# Patient Record
Sex: Female | Born: 1950 | Race: Black or African American | Hispanic: No | Smoking: Never smoker
Health system: Southern US, Community
[De-identification: ages and names within clinical notes are randomized; demographics above are authoritative.]

## PROBLEM LIST (undated history)

## (undated) DIAGNOSIS — Z972 Presence of dental prosthetic device (complete) (partial): Secondary | ICD-10-CM

## (undated) DIAGNOSIS — I10 Essential (primary) hypertension: Secondary | ICD-10-CM

## (undated) DIAGNOSIS — K219 Gastro-esophageal reflux disease without esophagitis: Secondary | ICD-10-CM

## (undated) DIAGNOSIS — E785 Hyperlipidemia, unspecified: Secondary | ICD-10-CM

## (undated) HISTORY — PX: PARTIAL HYSTERECTOMY: SHX80

---

## 2005-08-19 ENCOUNTER — Ambulatory Visit: Payer: Self-pay

## 2008-07-03 ENCOUNTER — Ambulatory Visit: Payer: Self-pay

## 2009-10-16 ENCOUNTER — Ambulatory Visit: Payer: Self-pay

## 2010-10-29 ENCOUNTER — Ambulatory Visit: Payer: Self-pay

## 2011-11-04 ENCOUNTER — Ambulatory Visit: Payer: Self-pay

## 2012-12-07 ENCOUNTER — Ambulatory Visit: Payer: Self-pay

## 2013-12-20 ENCOUNTER — Ambulatory Visit: Payer: Self-pay

## 2014-10-12 ENCOUNTER — Other Ambulatory Visit: Payer: Self-pay | Admitting: Oncology

## 2014-10-12 DIAGNOSIS — Z139 Encounter for screening, unspecified: Secondary | ICD-10-CM

## 2014-12-26 ENCOUNTER — Encounter: Payer: Self-pay | Admitting: *Deleted

## 2014-12-26 ENCOUNTER — Ambulatory Visit
Admission: RE | Admit: 2014-12-26 | Discharge: 2014-12-26 | Disposition: A | Discharge status: Home / Self Care | Payer: Self-pay | Source: Ambulatory Visit | Attending: Oncology | Admitting: Oncology

## 2014-12-26 ENCOUNTER — Ambulatory Visit: Payer: Self-pay

## 2014-12-26 ENCOUNTER — Ambulatory Visit: Payer: Self-pay | Attending: Oncology | Admitting: *Deleted

## 2014-12-26 VITALS — BP 120/86 | HR 74 | Temp 95.0°F | Resp 20 | Ht 61.42 in | Wt 147.3 lb

## 2014-12-26 DIAGNOSIS — Z0000 Encounter for general adult medical examination without abnormal findings: Secondary | ICD-10-CM

## 2014-12-26 NOTE — Progress Notes (Signed)
Subjective:     Patient ID: Kim Potter, female   DOB: 1951/01/22, 64 y.o.   MRN: [id number]  HPI   Review of Systems     Objective:   Physical Exam  Pulmonary/Chest: Right breast exhibits no inverted nipple, no mass, no nipple discharge, no skin change and no tenderness. Left breast exhibits no inverted nipple, no mass, no nipple discharge, no skin change and no tenderness. Breasts are symmetrical.  Lymphadenopathy:       Right axillary: No pectoral and no lateral adenopathy present.       Left axillary: No pectoral and no lateral adenopathy present.      Assessment:  64 year old Black female returns to [facility] for annual screening.  Clinical breast exam unremarkable.  Taught self breast awareness.  Patient has been screened for eligibility.  She does not have any insurance, Medicare or Medicaid.  She also meets financial eligibility.  Hand-out given on the Affordable Care Act.    Plan:  Screening mammogram ordered.  Follow-up per protocol.  Handout given on self breast awareness.

## 2014-12-27 ENCOUNTER — Other Ambulatory Visit: Payer: Self-pay | Admitting: *Deleted

## 2014-12-27 DIAGNOSIS — N63 Unspecified lump in unspecified breast: Secondary | ICD-10-CM

## 2015-01-02 ENCOUNTER — Ambulatory Visit
Admission: RE | Admit: 2015-01-02 | Discharge: 2015-01-02 | Disposition: A | Discharge status: Home / Self Care | Payer: Self-pay | Source: Ambulatory Visit | Attending: Oncology | Admitting: Oncology

## 2015-01-02 DIAGNOSIS — N63 Unspecified lump in unspecified breast: Secondary | ICD-10-CM

## 2015-01-02 DIAGNOSIS — N6001 Solitary cyst of right breast: Secondary | ICD-10-CM | POA: Insufficient documentation

## 2015-01-03 ENCOUNTER — Encounter: Payer: Self-pay | Admitting: *Deleted

## 2015-01-03 NOTE — Progress Notes (Signed)
Letter mailed from the [facility] to inform patient of her normal mammogram results.  Patient is to follow-up with annual screening in one year.  [facility] to Christy. 

## 2016-01-08 ENCOUNTER — Ambulatory Visit
Admission: RE | Admit: 2016-01-08 | Discharge: 2016-01-08 | Disposition: A | Discharge status: Home / Self Care | Payer: Self-pay | Source: Ambulatory Visit | Attending: Internal Medicine | Admitting: Internal Medicine

## 2016-01-08 ENCOUNTER — Ambulatory Visit: Payer: Self-pay | Attending: Internal Medicine

## 2016-01-08 VITALS — BP 126/88 | HR 81 | Temp 98.0°F | Ht 61.42 in | Wt 148.9 lb

## 2016-01-08 DIAGNOSIS — Z0000 Encounter for general adult medical examination without abnormal findings: Secondary | ICD-10-CM

## 2016-01-08 NOTE — Progress Notes (Signed)
Subjective:     Patient ID: Kim Potter, female   DOB: 1951/02/07, 65 y.o.   MRN: [id number]  HPI   Review of Systems     Objective:   Physical Exam  Pulmonary/Chest: Right breast exhibits no inverted nipple, no mass, no nipple discharge, no skin change and no tenderness. Left breast exhibits no inverted nipple, no mass, no nipple discharge, no skin change and no tenderness. Breasts are symmetrical.  Genitourinary: No labial fusion. There is no rash, tenderness, lesion or injury on the right labia. There is no rash, tenderness, lesion or injury on the left labia. Right adnexum displays no mass, no tenderness and no fullness. Left adnexum displays no mass, no tenderness and no fullness. No erythema, tenderness or bleeding in the vagina. No foreign body around the vagina. No signs of injury around the vagina. No vaginal discharge found.  Parital hysterectomy 40 years ago       Assessment:  65 year old  patient presents for [facility] clinic visit.  Patient screened, and meets [facility] eligibility.  Patient does not have insurance, Medicare or Medicaid.  Handout given on Affordable Care Act.  Instructed patient on breast self-exam using teach back method.  CBE unremarkable.  No mass or lump palpated.  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    Plan:    Sent for bilateral screening mammogram.

## 2016-01-09 ENCOUNTER — Other Ambulatory Visit: Payer: Self-pay | Admitting: *Deleted

## 2016-01-09 DIAGNOSIS — N6489 Other specified disorders of breast: Secondary | ICD-10-CM

## 2016-01-29 ENCOUNTER — Ambulatory Visit
Admission: RE | Admit: 2016-01-29 | Discharge: 2016-01-29 | Disposition: A | Discharge status: Home / Self Care | Payer: Self-pay | Source: Ambulatory Visit | Attending: Internal Medicine | Admitting: Internal Medicine

## 2016-01-29 DIAGNOSIS — N6489 Other specified disorders of breast: Secondary | ICD-10-CM

## 2016-02-19 NOTE — Progress Notes (Signed)
Letter mailed from [facility] to notify of normal mammogram results.  Patient to return in one year for annual screening.  Copy to HSIS. 

## 2017-01-04 ENCOUNTER — Telehealth: Payer: Self-pay | Admitting: Pharmacy Technician

## 2017-01-04 NOTE — Telephone Encounter (Signed)
Patient eligible to receive medication assistance at [facility] until 08/27/17 as long as eligibility requirements continue to be met.  Caelyn Route J. Owen Pagnotta Care Manager [facility] 

## 2017-01-13 ENCOUNTER — Ambulatory Visit
Admission: RE | Admit: 2017-01-13 | Discharge: 2017-01-13 | Disposition: A | Discharge status: Home / Self Care | Payer: Self-pay | Source: Ambulatory Visit | Attending: Oncology | Admitting: Oncology

## 2017-01-13 ENCOUNTER — Ambulatory Visit: Payer: Self-pay | Attending: Oncology

## 2017-01-13 VITALS — BP 137/84 | HR 80 | Temp 96.9°F | Resp 18 | Ht 61.0 in | Wt 135.0 lb

## 2017-01-13 DIAGNOSIS — Z0000 Encounter for general adult medical examination without abnormal findings: Secondary | ICD-10-CM

## 2017-01-13 NOTE — Progress Notes (Signed)
Subjective:     Patient ID: Kim Potter, female   DOB: March 12, 1951, 66 y.o.   MRN: [id number]  HPI   Review of Systems     Objective:   Physical Exam  Pulmonary/Chest: Right breast exhibits no inverted nipple, no mass, no nipple discharge, no skin change and no tenderness. Left breast exhibits no inverted nipple, no mass, no nipple discharge, no skin change and no tenderness. Breasts are symmetrical.       Assessment:     66 year old patient presents for [facility] clinic visitPatient screened, and meets [facility] eligibility.  Patient does not have insurance, Medicare or Medicaid.  Handout given on Affordable Care Act. Wardell Honour RN instructed patient on breast self-exam using teach back method.  CBE unremarkable.  No mass or lump palpated.  History of hysterectomy. Ovaries intact.  Pelvic exam normal.    Plan:     Sent for bilateral screening mammogram.

## 2017-01-14 NOTE — Progress Notes (Signed)
Letter mailed from [facility] to notify of normal mammogram results.  Patient to return in one year for annual screening.  Copy to HSIS. 

## 2017-03-30 ENCOUNTER — Emergency Department
Admission: EM | Admit: 2017-03-30 | Discharge: 2017-03-30 | Disposition: A | Discharge status: Home / Self Care | Payer: Self-pay | Attending: Emergency Medicine | Admitting: Emergency Medicine

## 2017-03-30 ENCOUNTER — Encounter: Payer: Self-pay | Admitting: Emergency Medicine

## 2017-03-30 ENCOUNTER — Emergency Department: Payer: Self-pay

## 2017-03-30 DIAGNOSIS — R079 Chest pain, unspecified: Secondary | ICD-10-CM | POA: Insufficient documentation

## 2017-03-30 DIAGNOSIS — R002 Palpitations: Secondary | ICD-10-CM | POA: Insufficient documentation

## 2017-03-30 DIAGNOSIS — I10 Essential (primary) hypertension: Secondary | ICD-10-CM | POA: Insufficient documentation

## 2017-03-30 HISTORY — DX: Essential (primary) hypertension: I10

## 2017-03-30 HISTORY — DX: Hyperlipidemia, unspecified: E78.5

## 2017-03-30 LAB — BASIC METABOLIC PANEL
Anion gap: 10 (ref 5–15)
BUN: 8 mg/dL (ref 6–20)
CALCIUM: 9.4 mg/dL (ref 8.9–10.3)
CO2: 28 mmol/L (ref 22–32)
CREATININE: 0.85 mg/dL (ref 0.44–1.00)
Chloride: 104 mmol/L (ref 101–111)
Glucose, Bld: 127 mg/dL — ABNORMAL HIGH (ref 65–99)
Potassium: 3.5 mmol/L (ref 3.5–5.1)
SODIUM: 142 mmol/L (ref 135–145)

## 2017-03-30 LAB — CBC
HCT: 39.7 % (ref 35.0–47.0)
Hemoglobin: 12.8 g/dL (ref 12.0–16.0)
MCH: 22.9 pg — ABNORMAL LOW (ref 26.0–34.0)
MCHC: 32.3 g/dL (ref 32.0–36.0)
MCV: 70.9 fL — ABNORMAL LOW (ref 80.0–100.0)
PLATELETS: 200 10*3/uL (ref 150–440)
RBC: 5.6 MIL/uL — AB (ref 3.80–5.20)
RDW: 15.5 % — ABNORMAL HIGH (ref 11.5–14.5)
WBC: 7.3 10*3/uL (ref 3.6–11.0)

## 2017-03-30 LAB — TROPONIN I

## 2017-03-30 LAB — TSH: TSH: 2.447 u[IU]/mL (ref 0.350–4.500)

## 2017-03-30 LAB — MAGNESIUM: MAGNESIUM: 1.9 mg/dL (ref 1.7–2.4)

## 2017-03-30 MED ORDER — MAGNESIUM SULFATE 2 GM/50ML IV SOLN
2.0000 g | Freq: Once | INTRAVENOUS | Status: AC
  Administered 2017-03-30: 2 g via INTRAVENOUS
  Filled 2017-03-30: qty 50

## 2017-03-30 MED ORDER — SODIUM CHLORIDE 0.9 % IV BOLUS (SEPSIS)
500.0000 mL | Freq: Once | INTRAVENOUS | Status: AC
  Administered 2017-03-30: 500 mL via INTRAVENOUS

## 2017-03-30 MED ORDER — METOPROLOL TARTRATE 25 MG PO TABS: 25.0000 mg | ORAL_TABLET | Freq: Two times a day (BID) | ORAL | 11 refills | Status: DC

## 2017-03-30 NOTE — ED Notes (Signed)
Assisted patient to the restroom.  

## 2017-03-30 NOTE — ED Provider Notes (Signed)
[facility] Emergency Department Provider Note   ____________________________________________   First MD Initiated Contact with Patient 03/30/17 [phone]     (approximate)  I have reviewed the triage vital signs and the nursing notes.   HISTORY  Chief Complaint Hypertension and Chest Pain    HPI Kim Potter is a 66 y.o. female who comes into the hospital today stating that she didn't feel well. Her blood pressure was high and her heart was racing. The patient states that she was laying in bed when the symptoms started. She had some slight chest discomfort like indigestion but no overt pain. The patient denies any shortness of breath nausea or vomiting. She reports that her doctor had recently decreased her blood pressure medicine but she wasn't sure if she needed to be back on more. The patient reports that her blood pressure was increased only today. She also did have a headache but it is currently gone. The patient denies any dizziness or lightheadedness. She rates her pain a 1 out of 10 in intensity. Since her blood pressure was elevated the patient wanted to come in and get checked out. She is here today for evaluation.   Past Medical History:  Diagnosis Date  . Hyperlipidemia   . Hypertension     There are no active problems to display for this patient.   Past Surgical History:  Procedure Laterality Date  . PARTIAL HYSTERECTOMY      Prior to Admission medications   Not on File    Allergies Penicillins  Family History  Problem Relation Age of Onset  . Breast cancer Neg Hx     Social History Social History  Substance Use Topics  . Smoking status: Never Smoker  . Smokeless tobacco: Never Used  . Alcohol use No    Review of Systems  Constitutional: No fever/chills Eyes: No visual changes. ENT: No sore throat. Cardiovascular: palpitations and chest pain Respiratory: Denies shortness of breath. Gastrointestinal: No abdominal pain.   No nausea, no vomiting.  No diarrhea.  No constipation. Genitourinary: Negative for dysuria. Musculoskeletal: Negative for back pain. Skin: Negative for rash. Neurological: Negative for headaches, focal weakness or numbness.   ____________________________________________   PHYSICAL EXAM:  VITAL SIGNS: ED Triage Vitals  Enc Vitals Group     BP 03/30/17 0320 (!) 173/104     Pulse Rate 03/30/17 0320 (!) 125     Resp 03/30/17 0320 18     Temp 03/30/17 0320 98.2 F (36.8 C)     Temp Source 03/30/17 0320 Oral     SpO2 03/30/17 0320 99 %     Weight 03/30/17 0321 142 lb (64.4 kg)     Height 03/30/17 0321 5\' 1"  (1.549 m)     Head Circumference --      Peak Flow --      Pain Score 03/30/17 0320 2     Pain Loc --      Pain Edu? --      Excl. in [place]? --     Constitutional: Alert and oriented. Well appearing and in mild distress. Eyes: Conjunctivae are normal. PERRL. EOMI. Head: Atraumatic. Nose: No congestion/rhinnorhea. Mouth/Throat: Mucous membranes are moist.  Oropharynx non-erythematous. Cardiovascular: tachycardia, regular rhythm. Grossly normal heart sounds.  Good peripheral circulation. Respiratory: Normal respiratory effort.  No retractions. Lungs CTAB. Gastrointestinal: Soft and nontender. No distention. positive bowel sounds Musculoskeletal: No lower extremity tenderness nor edema.   Neurologic:  Normal speech and language.  Skin:  Skin  is warm, dry and intact.  Psychiatric: Mood and affect are normal.   ____________________________________________   LABS (all labs ordered are listed, but only abnormal results are displayed)  Labs Reviewed  BASIC METABOLIC PANEL - Abnormal; Notable for the following:       Result Value   Glucose, Bld 127 (*)    All other components within normal limits  CBC - Abnormal; Notable for the following:    RBC 5.60 (*)    MCV 70.9 (*)    MCH 22.9 (*)    RDW 15.5 (*)    All other components within normal limits  TROPONIN I  TSH    MAGNESIUM  TROPONIN I   ____________________________________________  EKG  ED ECG REPORT I, Loney Hering, the attending physician, personally viewed and interpreted this ECG.   Date: 03/30/2017  EKG Time: 310  Rate: 126  Rhythm: sinus tachycardia  Axis: normal  Intervals:none  ST&T Change: none  ____________________________________________  RADIOLOGY  Dg Chest 2 View  Result Date: 03/30/2017 CLINICAL DATA:  Headache and chest pain.  History of hypertension. EXAM: CHEST  2 VIEW COMPARISON:  None. FINDINGS: Normal heart size and pulmonary vascularity. Peribronchial thickening with central interstitial changes likely representing acute or chronic bronchitis versus airways disease. No focal consolidation or airspace disease. No blunting of costophrenic angles. No pneumothorax. IMPRESSION: Peribronchial thickening and central interstitial changes suggesting bronchitis versus airways disease. No focal consolidation. Electronically Signed   By: Lucienne Capers M.D.   On: 03/30/2017 03:47    ____________________________________________   PROCEDURES  Procedure(s) performed: None  Procedures  Critical Care performed: No  ____________________________________________   INITIAL IMPRESSION / ASSESSMENT AND PLAN / ED COURSE  Pertinent labs & imaging results that were available during my care of the patient were reviewed by me and considered in my medical decision making (see chart for details).  this is a 66 year old female who comes into the hospital today with palpitations, hypertension and chest discomfort. I did check some blood work which was unremarkable. The patient's tachycardia was sinus tachycardia. I gave the patient a 500 mL bolus of normal saline and checked a magnesium a TSH and a troponin. The patient's magnesium returned at 1.9. I did give the patient a dose of magnesium as well. The patient did have resolution of her tachycardia but then it did return. This  was prior to the magnesium. I will give the patient a second 500, bolus of normal saline and I will discharge the patient. The patient's repeat troponin was also unremarkable. The patient is taking triamterene and HCTZ. After the fluids the patient will be discharged to home if she is still asymptomatic.      ____________________________________________   FINAL CLINICAL IMPRESSION(S) / ED DIAGNOSES  Final diagnoses:  Palpitations  Hypertension, unspecified type      NEW MEDICATIONS STARTED DURING THIS VISIT:  New Prescriptions   No medications on file     Note:  This document was prepared using Dragon voice recognition software and may include unintentional dictation errors.    Loney Hering, MD 03/30/17 [phone]

## 2017-03-30 NOTE — ED Provider Notes (Signed)
patient is in no distress and repeat troponin is negative. I will start a low-dose beta blocker and advised outpatient follow-up with her doctor in a week.   Earleen Newport, MD 03/30/17 0830

## 2017-03-30 NOTE — ED Notes (Signed)
NAD noted at time of D/C. Pt denies questions or concerns. Pt ambulatory to the lobby at this time.  

## 2017-03-30 NOTE — ED Notes (Signed)
This RN to bedside to introduce self to patient. Pt up to the bathroom and back to bed without incident with this RN at bedside. Medication administered per MD order. Pt denies further needs. Will continue to monitor for further patient needs.

## 2017-03-30 NOTE — ED Notes (Signed)
Medication adminstered per MD order. Pt once again up to the bathroom and back with this RN at bedside, without incident. Pt denies any further needs. Will continue to monitor for further patient needs.

## 2017-03-30 NOTE — Discharge Instructions (Signed)
please ensure that she was drinking adequate amounts of water. Also please follow back up with her primary care physician for further evaluation of her blood pressure and medication.

## 2017-03-30 NOTE — ED Triage Notes (Signed)
Patient to ER for c/o HTN. States she has had slight headache and minor chest pain. Also states her chest has felt "like it was pounding".

## 2017-06-30 ENCOUNTER — Telehealth: Payer: Self-pay | Admitting: Pharmacist

## 2017-06-30 NOTE — Telephone Encounter (Signed)
Faxed Dr. Joella Prince 06-28-17 regarding potential drug interactions for Ms. Luce. Dr. Joella Prince faxed back a response on 06/30/17.   Patient was recently prescribed clarithromycin, omeprazole and metronidazole.  Previously a quadruple therapy (omeprazole, bismuth subcitrate, metronidazole and tetracycline) for H. Pylori, but [facility] was not able to obtain the tetracycline.  Potential drug-drug interactions noted: Simvastatin 20 mg at bedtime and clarithromycin- Level 1 per QS1- risk of increased levels of statin Clarithromycin/Citalopram; Fluconazole/Clarithromycin; Fluconazole/Citalopram: all three have an increased risk of QT prolongation or torsades de pointes. Pt is 67 years old.  Pharmacist Suggestion: Hold simvastatin during 14-day treatment Hold fluconazole until after (if appropriate)  MD response: Declined holding simvastatin; feels dose is low Declined holding fluconazole; stated patient is no longer taking.  Patient will be counseled when she picks up her medications.  Will let her know that she does not need to take the bismuth with the triple therapy regimen. Also tried to call Ms. Zavadil; left a message to call me.  Milt Coye K. Dicky Doe, PharmD [facility] [place] Operations Coordinator [phone]

## 2017-08-02 ENCOUNTER — Encounter (INDEPENDENT_AMBULATORY_CARE_PROVIDER_SITE_OTHER): Payer: Self-pay

## 2017-08-02 ENCOUNTER — Ambulatory Visit: Payer: Self-pay | Admitting: Pharmacist

## 2017-08-02 ENCOUNTER — Other Ambulatory Visit: Payer: Self-pay

## 2017-08-02 ENCOUNTER — Encounter: Payer: Self-pay | Admitting: Pharmacist

## 2017-08-02 DIAGNOSIS — Z79899 Other long term (current) drug therapy: Secondary | ICD-10-CM

## 2017-08-02 NOTE — Patient Instructions (Signed)
Look at extra help application with your daughter. (Social Security website).   Once you find out how much subsidy you qualify for from either Social Security and Social Services, you can look at the [employer] plan.  You can use www.medicare.gov to compare plans.

## 2017-08-02 NOTE — Progress Notes (Signed)
Kim Potter is here today to review Medicare Part D/Advantage plans. She states that she is in the "information gathering" phase.  Patient's current medication list was reviewed and all of her medications can be obtained through a Medicare Part D/Advantage plan. Patient will be eligible to sign up for a Medicare Part D/Advantage plan on 08/27/2017. Medicare.gov was used to review the available Part D/Advantage plans. Differences and similarities of traditional Medicare with a Medicare Part D plan vs. a Medicare Advantage (A, B and D) plan were discussed. Patient qualifies for the following subsidies: MSP/LIS*   *MSP = Medicare Savings Plan (Assists with Medicare A and B - premiums, deductibles, coinsurance and copayments) *LIS = Low Income Subsidy (Assists with Medicare Part D - premiums, deductibles and copayments)  Prior to Admission medications   Medication Sig Start Date End Date Taking? Authorizing Provider  aspirin EC 81 MG tablet Take 81 mg by mouth daily.   Yes [provider]  citalopram (CELEXA) 10 MG tablet Take 10 mg by mouth daily.   Yes Kizer, Gwynn Burly, MD  fluticasone (FLONASE) 50 MCG/ACT nasal spray Place 1 spray into both nostrils daily.   Yes [provider]  simvastatin (ZOCOR) 20 MG tablet Take 20 mg by mouth daily.   Yes [provider]  triamterene-hydrochlorothiazide (MAXZIDE-25) 37.5-25 MG tablet Take 0.5 tablets by mouth daily.   Yes [provider]     PLAN: Patient was given the websites for the Extra Help application and StartupExpense.be; patient would like to review the information she received today with her daughter.  Patient was told that [facility] can assist with filling out the Extra Help application online and signing up for a Part D or Advantage plan. Patient was also told that based on her income, the Social Security office may auto-enroll her into a Medicare Part D plan. If this happens, not to worry, as she  will be able to switch Part D plans and/or sign up for a Medicare Advantage plan once per month.    Kim Potter K. Dicky Doe, PharmD [facility] [place] Operations Coordinator [phone]

## 2017-10-01 ENCOUNTER — Telehealth: Payer: Self-pay | Admitting: Pharmacist

## 2017-10-01 NOTE — Telephone Encounter (Signed)
Called Mrs. Pintor and left a message to call me on my office phone at [phone] to discuss her Medicare questions.  Joie Hipps K. Dicky Doe, PharmD [facility] [place] Operations Coordinator [phone]

## 2017-10-05 ENCOUNTER — Telehealth: Payer: Self-pay | Admitting: Pharmacist

## 2017-10-05 NOTE — Telephone Encounter (Signed)
Called Kim Potter to discuss her Medicare. She is new to Medicare and had several questions. She received a letter from [employer] stating that her Part B premium will be covered in full. This benefit runs through 05-2018 per her letter. She may need to reapply for the benefit at [employer]. She signed up for the [name] which includes dental, vision and hearing services. Prescription copays: $3.40 generics or $8.50 brand-name.  PLAN: Patient will call [facility] if she has further questions.  Travon Crochet K. Dicky Doe, PharmD [facility] [place] Operations Coordinator [phone]

## 2017-12-06 ENCOUNTER — Other Ambulatory Visit: Payer: Self-pay | Admitting: Gastroenterology

## 2017-12-06 DIAGNOSIS — Z1231 Encounter for screening mammogram for malignant neoplasm of breast: Secondary | ICD-10-CM

## 2019-12-26 ENCOUNTER — Other Ambulatory Visit: Payer: Self-pay

## 2019-12-26 DIAGNOSIS — K56609 Unspecified intestinal obstruction, unspecified as to partial versus complete obstruction: Secondary | ICD-10-CM | POA: Diagnosis not present

## 2019-12-26 DIAGNOSIS — Z880 Allergy status to penicillin: Secondary | ICD-10-CM

## 2019-12-26 DIAGNOSIS — Z7982 Long term (current) use of aspirin: Secondary | ICD-10-CM

## 2019-12-26 DIAGNOSIS — Z923 Personal history of irradiation: Secondary | ICD-10-CM

## 2019-12-26 DIAGNOSIS — K565 Intestinal adhesions [bands], unspecified as to partial versus complete obstruction: Principal | ICD-10-CM | POA: Diagnosis present

## 2019-12-26 DIAGNOSIS — Z8521 Personal history of malignant neoplasm of larynx: Secondary | ICD-10-CM

## 2019-12-26 DIAGNOSIS — I10 Essential (primary) hypertension: Secondary | ICD-10-CM | POA: Diagnosis present

## 2019-12-26 DIAGNOSIS — Z8616 Personal history of COVID-19: Secondary | ICD-10-CM

## 2019-12-26 DIAGNOSIS — Z79899 Other long term (current) drug therapy: Secondary | ICD-10-CM

## 2019-12-26 DIAGNOSIS — Z841 Family history of disorders of kidney and ureter: Secondary | ICD-10-CM

## 2019-12-26 DIAGNOSIS — E785 Hyperlipidemia, unspecified: Secondary | ICD-10-CM | POA: Diagnosis present

## 2019-12-26 DIAGNOSIS — Z833 Family history of diabetes mellitus: Secondary | ICD-10-CM

## 2019-12-26 DIAGNOSIS — Z9221 Personal history of antineoplastic chemotherapy: Secondary | ICD-10-CM

## 2019-12-26 MED ORDER — SODIUM CHLORIDE 0.9% FLUSH
3.0000 mL | Freq: Once | INTRAVENOUS | Status: AC
  Administered 2019-12-27: 3 mL via INTRAVENOUS

## 2019-12-26 NOTE — ED Triage Notes (Signed)
Pt to the er for abd pain. Pt states it has been going on for a month and would improve with food however pt unable to eat due to vomiting. Last BM an hour ago but was hard which is not her normal.

## 2019-12-27 ENCOUNTER — Inpatient Hospital Stay
Admission: EM | Admit: 2019-12-27 | Discharge: 2020-01-02 | DRG: 337 | Disposition: A | Discharge status: Home / Self Care | Payer: Medicare HMO | Attending: Surgery | Admitting: Surgery

## 2019-12-27 ENCOUNTER — Emergency Department: Payer: Medicare HMO

## 2019-12-27 ENCOUNTER — Inpatient Hospital Stay: Payer: Medicare HMO

## 2019-12-27 DIAGNOSIS — Z7982 Long term (current) use of aspirin: Secondary | ICD-10-CM | POA: Diagnosis not present

## 2019-12-27 DIAGNOSIS — K56609 Unspecified intestinal obstruction, unspecified as to partial versus complete obstruction: Secondary | ICD-10-CM | POA: Diagnosis present

## 2019-12-27 DIAGNOSIS — K565 Intestinal adhesions [bands], unspecified as to partial versus complete obstruction: Secondary | ICD-10-CM | POA: Diagnosis present

## 2019-12-27 DIAGNOSIS — Z880 Allergy status to penicillin: Secondary | ICD-10-CM | POA: Diagnosis not present

## 2019-12-27 DIAGNOSIS — Z841 Family history of disorders of kidney and ureter: Secondary | ICD-10-CM | POA: Diagnosis not present

## 2019-12-27 DIAGNOSIS — Z8521 Personal history of malignant neoplasm of larynx: Secondary | ICD-10-CM | POA: Diagnosis not present

## 2019-12-27 DIAGNOSIS — E785 Hyperlipidemia, unspecified: Secondary | ICD-10-CM | POA: Diagnosis present

## 2019-12-27 DIAGNOSIS — I10 Essential (primary) hypertension: Secondary | ICD-10-CM | POA: Diagnosis present

## 2019-12-27 DIAGNOSIS — Z8616 Personal history of COVID-19: Secondary | ICD-10-CM | POA: Diagnosis not present

## 2019-12-27 DIAGNOSIS — E440 Moderate protein-calorie malnutrition: Secondary | ICD-10-CM | POA: Insufficient documentation

## 2019-12-27 DIAGNOSIS — Z79899 Other long term (current) drug therapy: Secondary | ICD-10-CM | POA: Diagnosis not present

## 2019-12-27 DIAGNOSIS — Z833 Family history of diabetes mellitus: Secondary | ICD-10-CM | POA: Diagnosis not present

## 2019-12-27 DIAGNOSIS — Z923 Personal history of irradiation: Secondary | ICD-10-CM | POA: Diagnosis not present

## 2019-12-27 DIAGNOSIS — Z9221 Personal history of antineoplastic chemotherapy: Secondary | ICD-10-CM | POA: Diagnosis not present

## 2019-12-27 DIAGNOSIS — Z978 Presence of other specified devices: Secondary | ICD-10-CM

## 2019-12-27 LAB — COMPREHENSIVE METABOLIC PANEL
ALT: 25 U/L (ref 0–44)
AST: 26 U/L (ref 15–41)
Albumin: 4.6 g/dL (ref 3.5–5.0)
Alkaline Phosphatase: 64 U/L (ref 38–126)
Anion gap: 13 (ref 5–15)
BUN: 16 mg/dL (ref 8–23)
CO2: 26 mmol/L (ref 22–32)
Calcium: 9.8 mg/dL (ref 8.9–10.3)
Chloride: 99 mmol/L (ref 98–111)
Creatinine, Ser: 0.96 mg/dL (ref 0.44–1.00)
GFR calc Af Amer: >60 mL/min (ref >60)
GFR calc non Af Amer: >60 mL/min (ref >60)
Glucose, Bld: 141 mg/dL — ABNORMAL HIGH (ref 70–99)
Potassium: 3.8 mmol/L (ref 3.5–5.1)
Sodium: 138 mmol/L (ref 135–145)
Total Bilirubin: 0.9 mg/dL (ref 0.3–1.2)
Total Protein: 8.4 g/dL — ABNORMAL HIGH (ref 6.5–8.1)

## 2019-12-27 LAB — CBC
HCT: 38 % (ref 36.0–46.0)
Hemoglobin: 13.2 g/dL (ref 12.0–15.0)
MCH: 23.8 pg — ABNORMAL LOW (ref 26.0–34.0)
MCHC: 34.7 g/dL (ref 30.0–36.0)
MCV: 68.6 fL — ABNORMAL LOW (ref 80.0–100.0)
Platelets: 170 10*3/uL (ref 150–400)
RBC: 5.54 MIL/uL — ABNORMAL HIGH (ref 3.87–5.11)
RDW: 15.6 % — ABNORMAL HIGH (ref 11.5–15.5)
WBC: 10 10*3/uL (ref 4.0–10.5)
nRBC: 0 % (ref 0.0–0.2)

## 2019-12-27 LAB — PROCALCITONIN: Procalcitonin: <0.1 ng/mL

## 2019-12-27 LAB — HIV ANTIBODY (ROUTINE TESTING W REFLEX): HIV Screen 4th Generation wRfx: NONREACTIVE

## 2019-12-27 LAB — LACTIC ACID, PLASMA: Lactic Acid, Venous: 1.7 mmol/L (ref 0.5–1.9)

## 2019-12-27 LAB — SARS CORONAVIRUS 2 BY RT PCR (HOSPITAL ORDER, PERFORMED IN ~~LOC~~ HOSPITAL LAB): SARS Coronavirus 2: NEGATIVE

## 2019-12-27 LAB — LIPASE, BLOOD: Lipase: 30 U/L (ref 11–51)

## 2019-12-27 MED ORDER — METOPROLOL SUCCINATE ER 50 MG PO TB24
50.0000 mg | ORAL_TABLET | Freq: Every day | ORAL | Status: DC
  Administered 2019-12-27 – 2020-01-02 (×6): 50 mg via ORAL
  Filled 2019-12-27 (×6): qty 1

## 2019-12-27 MED ORDER — ENOXAPARIN SODIUM 40 MG/0.4ML ~~LOC~~ SOLN
40.0000 mg | SUBCUTANEOUS | Status: DC
  Filled 2019-12-27: qty 0.4

## 2019-12-27 MED ORDER — FLUTICASONE PROPIONATE 50 MCG/ACT NA SUSP
1.0000 | Freq: Every day | NASAL | Status: DC | PRN
  Filled 2019-12-27: qty 16

## 2019-12-27 MED ORDER — TRAMADOL HCL 50 MG PO TABS: 50.0000 mg | ORAL_TABLET | Freq: Four times a day (QID) | ORAL | Status: DC | PRN

## 2019-12-27 MED ORDER — LACTATED RINGERS IV SOLN: INTRAVENOUS | Status: DC

## 2019-12-27 MED ORDER — ONDANSETRON 4 MG PO TBDP: 4.0000 mg | ORAL_TABLET | Freq: Four times a day (QID) | ORAL | Status: DC | PRN

## 2019-12-27 MED ORDER — TRIAMTERENE-HCTZ 37.5-25 MG PO TABS
1.0000 | ORAL_TABLET | Freq: Every day | ORAL | Status: DC
  Administered 2019-12-28: 1 via ORAL
  Filled 2019-12-27 (×2): qty 1

## 2019-12-27 MED ORDER — FAMOTIDINE 20 MG PO TABS
40.0000 mg | ORAL_TABLET | Freq: Every evening | ORAL | Status: DC
  Administered 2019-12-27 – 2020-01-01 (×5): 40 mg via ORAL
  Filled 2019-12-27 (×5): qty 2

## 2019-12-27 MED ORDER — SIMVASTATIN 20 MG PO TABS
20.0000 mg | ORAL_TABLET | Freq: Every morning | ORAL | Status: DC
  Administered 2019-12-27 – 2020-01-02 (×6): 20 mg via ORAL
  Filled 2019-12-27 (×2): qty 1
  Filled 2019-12-27: qty 2
  Filled 2019-12-27 (×4): qty 1

## 2019-12-27 MED ORDER — MORPHINE SULFATE (PF) 4 MG/ML IV SOLN
4.0000 mg | Freq: Once | INTRAVENOUS | Status: AC
  Administered 2019-12-27: 4 mg via INTRAVENOUS
  Filled 2019-12-27: qty 1

## 2019-12-27 MED ORDER — SODIUM CHLORIDE 0.9 % IV BOLUS
1000.0000 mL | Freq: Once | INTRAVENOUS | Status: AC
  Administered 2019-12-27: 1000 mL via INTRAVENOUS

## 2019-12-27 MED ORDER — LORATADINE 10 MG PO TABS
10.0000 mg | ORAL_TABLET | Freq: Every day | ORAL | Status: DC
  Administered 2019-12-28 – 2020-01-02 (×4): 10 mg via ORAL
  Filled 2019-12-27 (×6): qty 1

## 2019-12-27 MED ORDER — DIATRIZOATE MEGLUMINE & SODIUM 66-10 % PO SOLN
90.0000 mL | Freq: Once | ORAL | Status: AC
  Administered 2019-12-27: 90 mL via ORAL

## 2019-12-27 MED ORDER — AMLODIPINE BESYLATE 5 MG PO TABS
2.5000 mg | ORAL_TABLET | Freq: Every day | ORAL | Status: DC
  Filled 2019-12-27: qty 1

## 2019-12-27 MED ORDER — SUCRALFATE 1 G PO TABS
1.0000 g | ORAL_TABLET | Freq: Two times a day (BID) | ORAL | Status: DC
  Administered 2019-12-27 – 2020-01-02 (×12): 1 g via ORAL
  Filled 2019-12-27 (×12): qty 1

## 2019-12-27 MED ORDER — AMLODIPINE BESYLATE 5 MG PO TABS
5.0000 mg | ORAL_TABLET | Freq: Once | ORAL | Status: AC
  Administered 2019-12-27: 5 mg via ORAL

## 2019-12-27 MED ORDER — HYDROCODONE-ACETAMINOPHEN 5-325 MG PO TABS
1.0000 | ORAL_TABLET | ORAL | Status: DC | PRN
  Administered 2019-12-28: 2 via ORAL
  Filled 2019-12-27: qty 2

## 2019-12-27 MED ORDER — LIDOCAINE VISCOUS HCL 2 % MT SOLN: 15.0000 mL | Freq: Once | OROMUCOSAL | Status: DC

## 2019-12-27 MED ORDER — MORPHINE SULFATE (PF) 2 MG/ML IV SOLN
2.0000 mg | INTRAVENOUS | Status: DC | PRN
  Administered 2019-12-27 – 2019-12-29 (×7): 2 mg via INTRAVENOUS
  Filled 2019-12-27 (×7): qty 1

## 2019-12-27 MED ORDER — SIMVASTATIN 10 MG PO TABS: 20.0000 mg | ORAL_TABLET | Freq: Every day | ORAL | Status: DC

## 2019-12-27 MED ORDER — ONDANSETRON HCL 4 MG/2ML IJ SOLN
4.0000 mg | INTRAMUSCULAR | Status: AC
  Administered 2019-12-27: 4 mg via INTRAVENOUS
  Filled 2019-12-27: qty 2

## 2019-12-27 MED ORDER — IOHEXOL 300 MG/ML  SOLN
75.0000 mL | Freq: Once | INTRAMUSCULAR | Status: AC | PRN
  Administered 2019-12-27: 75 mL via INTRAVENOUS

## 2019-12-27 MED ORDER — AMLODIPINE BESYLATE 5 MG PO TABS
5.0000 mg | ORAL_TABLET | Freq: Every day | ORAL | Status: DC
  Administered 2019-12-28 – 2020-01-02 (×5): 5 mg via ORAL
  Filled 2019-12-27 (×5): qty 1

## 2019-12-27 MED ORDER — ONDANSETRON HCL 4 MG/2ML IJ SOLN
4.0000 mg | Freq: Four times a day (QID) | INTRAMUSCULAR | Status: DC | PRN
  Administered 2019-12-28 – 2019-12-29 (×2): 4 mg via INTRAVENOUS
  Filled 2019-12-27 (×2): qty 2

## 2019-12-27 NOTE — ED Provider Notes (Signed)
[facility] Emergency Department Provider Note  ____________________________________________   First MD Initiated Contact with Patient 12/27/19 0321     (approximate)  I have reviewed the triage vital signs and the nursing notes.   HISTORY  Chief Complaint Abdominal Pain    HPI Kim Potter is a 69 y.o. female who presents for evaluation of about a month of intermittent abdominal pain that has become much more severe over the last couple of days.  Is primarily located in the left lower part of her abdomen although it radiates throughout the abdomen.  It feels better after she eats and worse when she is empty.  She has had a couple of episodes of vomiting and some persistent nausea.  No diarrhea, possibly some constipation.  She denies fever/chills, sore throat, chest pain, and dysuria.  She reports that she has never had surgery but she has been told that her gallbladder is "low".   The pain is severe and hurts when she pushes on it.        Past Medical History:  Diagnosis Date  . Hyperlipidemia   . Hypertension     Patient Active Problem List   Diagnosis Date Noted  . SBO (small bowel obstruction) ([place]) 12/27/2019    Past Surgical History:  Procedure Laterality Date  . PARTIAL HYSTERECTOMY      Prior to Admission medications   Medication Sig Start Date End Date Taking? Authorizing Provider  acetaminophen (TYLENOL) 325 MG tablet Take by mouth.  04/18/19  Yes [provider]  amLODipine (NORVASC) 10 MG tablet Take 0.5 tablets (5 mg total) by mouth daily. 06/15/19  Yes [provider]  famotidine (PEPCID) 40 MG tablet Take 40 mg by mouth every evening.  11/30/19 12/30/19 Yes [provider]  fexofenadine (ALLEGRA) 180 MG tablet Take by mouth.   Yes [provider]  fluticasone (FLONASE) 50 MCG/ACT nasal spray Place 1 spray into both nostrils daily.   Yes [provider]  metoprolol succinate (TOPROL-XL) 50  MG 24 hr tablet Take 50 mg by mouth daily.  06/15/19 06/14/20 Yes [provider]  simvastatin (ZOCOR) 20 MG tablet Take 20 mg by mouth at bedtime.    Yes [provider]    Allergies Penicillins  Family History  Problem Relation Age of Onset  . Diabetes Brother   . Kidney disease Brother   . Breast cancer Neg Hx     Social History Social History   Tobacco Use  . Smoking status: Never Smoker  . Smokeless tobacco: Never Used  Vaping Use  . Vaping Use: Never used  Substance Use Topics  . Alcohol use: No  . Drug use: No    Review of Systems Constitutional: No fever/chills Eyes: No visual changes. ENT: No sore throat. Cardiovascular: Denies chest pain. Respiratory: Denies shortness of breath. Gastrointestinal: 1 month of abdominal pain, worse over the last couple of days.  Some associated nausea and vomiting.  Possible constipation. Genitourinary: Negative for dysuria. Musculoskeletal: Negative for neck pain.  Negative for back pain. Integumentary: Negative for rash. Neurological: Negative for headaches, focal weakness or numbness.   ____________________________________________   PHYSICAL EXAM:  VITAL SIGNS: ED Triage Vitals  Enc Vitals Group     BP 12/27/19 0021 118/69     Pulse Rate 12/27/19 0021 68     Resp 12/27/19 0021 18     Temp 12/27/19 0021 98.3 F (36.8 C)     Temp src --  SpO2 12/27/19 0021 100 %     Weight 12/26/19 2354 48.5 kg (107 lb)     Height 12/26/19 2354 1.549 m (5\' 1" )     Head Circumference --      Peak Flow --      Pain Score 12/26/19 2353 10     Pain Loc --      Pain Edu? --      Excl. in [place]? --     Constitutional: Alert and oriented.  Eyes: Conjunctivae are normal.  Head: Atraumatic. Nose: No congestion/rhinnorhea. Mouth/Throat: Patient is wearing a mask. Neck: No stridor.  No meningeal signs.   Cardiovascular: Normal rate, regular rhythm. Good peripheral circulation. Grossly normal heart  sounds. Respiratory: Normal respiratory effort.  No retractions. Gastrointestinal: Soft and nondistended.  Tender to palpation of the left lower quadrant with some guarding, no rebound, no epigastric or right upper quadrant tenderness to palpation, negative Murphy sign. Musculoskeletal: No lower extremity tenderness nor edema. No gross deformities of extremities. Neurologic:  Normal speech and language. No gross focal neurologic deficits are appreciated.  Skin:  Skin is warm, dry and intact. Psychiatric: Mood and affect are normal. Speech and behavior are normal.  ____________________________________________   LABS (all labs ordered are listed, but only abnormal results are displayed)  Labs Reviewed  COMPREHENSIVE METABOLIC PANEL - Abnormal; Notable for the following components:      Result Value   Glucose, Bld 141 (*)    Total Protein 8.4 (*)    All other components within normal limits  CBC - Abnormal; Notable for the following components:   RBC 5.54 (*)    MCV 68.6 (*)    MCH 23.8 (*)    RDW 15.6 (*)    All other components within normal limits  SARS CORONAVIRUS 2 BY RT PCR (HOSPITAL ORDER, [place] [facility])  LIPASE, BLOOD  LACTIC ACID, PLASMA  PROCALCITONIN  URINALYSIS, COMPLETE (UACMP) WITH MICROSCOPIC  LACTIC ACID, PLASMA   ____________________________________________  EKG  ED ECG REPORT I, Hinda Kehr, the attending physician, personally viewed and interpreted this ECG.  Date: 12/27/2019 EKG Time: 00:02 Rate: 70 Rhythm: normal sinus rhythm QRS Axis: normal Intervals: normal ST/T Wave abnormalities: normal Narrative Interpretation: no evidence of acute ischemia  ____________________________________________  RADIOLOGY I, Hinda Kehr, personally viewed and evaluated these images (plain radiographs) as part of my medical decision making, as well as reviewing the written report by the radiologist.  ED MD interpretation: High-grade bowel  obstruction with bowel loop twisting.  Official radiology report(s): CT ABDOMEN PELVIS W CONTRAST  Result Date: 12/27/2019 CLINICAL DATA:  Diverticulitis suspected.  Nausea and vomiting EXAM: CT ABDOMEN AND PELVIS WITH CONTRAST TECHNIQUE: Multidetector CT imaging of the abdomen and pelvis was performed using the standard protocol following bolus administration of intravenous contrast. CONTRAST:  83mL OMNIPAQUE IOHEXOL 300 MG/ML  SOLN COMPARISON:  None. FINDINGS: Lower chest:  No contributory findings. Hepatobiliary: No focal liver abnormality.Contrast in the nondilated gallbladder. Pancreas: Unremarkable. Spleen: Unremarkable. Adrenals/Urinary Tract: Negative adrenals. No hydronephrosis or ureteral stone. Punctate lower pole calculus on the left. Bilateral renal cystic densities measuring up to 2.5 cm on the left. Contrast in the urinary bladder from outside procedure. Stomach/Bowel: Small bowel obstruction with twisting of loops and mesenteric distortion in the deep left paramedian abdomen as marked on axial and coronal images. The distal small bowel is decompressed there is mild colonic stool retention. The stomach and proximal small bowel is also decompressed. No evidence of bowel perforation  or necrosis. No evident underlying inflammation. Vascular/Lymphatic: Diffuse atheromatous wall thickening of the aorta and iliacs. No mass or adenopathy. Reproductive:Hysterectomy. 2.8 cm cystic density in the right ovary with simple appearance. Other: No ascites or pneumoperitoneum. Musculoskeletal: No acute abnormalities. Lumbar facet osteoarthritis with mild L3-4 and L4-5 anterolisthesis. IMPRESSION: 1. High-grade small bowel obstruction with bowel loop twisting. This finding was not present/noted on an outside abdominal CT report from earlier today. Both proximal and distal loops are decompressed. 2. 2.8 cm right ovarian cyst which could be characterized by outpatient ultrasound. Electronically Signed   By: Monte Fantasia M.D.   On: 12/27/2019 04:55    ____________________________________________   PROCEDURES   Procedure(s) performed (including Critical Care):  Procedures   ____________________________________________   INITIAL IMPRESSION / MDM / [place] / ED COURSE  As part of my medical decision making, I reviewed the following data within the [place] notes reviewed and incorporated, Labs reviewed , Old chart reviewed, Discussed with admitting physician , Discussed with general surgery (Dr. Lysle Pearl) and Notes from prior ED visits   Differential diagnosis includes, but is not limited to, diverticulitis, SBO/ileus, appendicitis, biliary colic, mesenteric ischemia.  The patient's labs are reassuring with no leukocytosis and a normal comprehensive metabolic panel.  Lactic acid is within normal limits and procalcitonin is pending but this is not presenting like an acute infection unless she has a subacute diverticulitis it has been slowly worsening over time.  Given her nausea and vomiting and SBO ileus is also possible but she reports that she often feels better after eating which would be very unusual.  This also applies to the possibility of gallbladder disease.  Additionally her physical exam demonstrates no epigastric or right upper quadrant tenderness.  I will proceed with a CT of the abdomen pelvis with IV contrast for further evaluation.  Patient understands and agrees.  Given her acute pain I am giving her morphine 4 mg IV and Zofran 4 mg IV.  We will also order 1 L normal saline IV fluid given decreased oral intake.       Clinical Course as of Dec 26 632  Wed Dec 27, 2019  0506 CT consistent with high-grade bowel obstruction with "bowel twisting".  I called and left a HIPAA compliant message on Dr. Ines Bloomer cell phone to discuss the case.   [CF]  0508 Of note, the patient did not initially disclose to me that she had been to [facility] within the last 24  hours for evaluation of the same symptoms and received both the CT scan with IV contrast and a right upper quadrant ultrasound.  I was able to find the results of both and both studies were negative at the time.  The CT scan tonight is acutely different.   [CF]  6967 Discussing case in person with Dr. Lysle Pearl who will evaluate the patient and offer recommendations.  Given her history of laryngeal cancer and radiation therapy, we are holding on the NGT which may be better placed under fluoro.   [CF]  8938 Dr. Lysle Pearl will admit the patient for conservative management.   [CF]    Clinical Course User Index [CF] Hinda Kehr, MD     ____________________________________________  FINAL CLINICAL IMPRESSION(S) / ED DIAGNOSES  Final diagnoses:  Small bowel obstruction ([place])     MEDICATIONS GIVEN DURING THIS VISIT:  Medications  lidocaine (XYLOCAINE) 2 % viscous mouth solution 15 mL (0 mLs Mouth/Throat Hold 12/27/19 0614)  sodium chloride  flush (NS) 0.9 % injection 3 mL (3 mLs Intravenous Given 12/27/19 0408)  morphine 4 MG/ML injection 4 mg (4 mg Intravenous Given 12/27/19 0400)  ondansetron (ZOFRAN) injection 4 mg (4 mg Intravenous Given 12/27/19 0400)  iohexol (OMNIPAQUE) 300 MG/ML solution 75 mL (75 mLs Intravenous Contrast Given 12/27/19 0417)  sodium chloride 0.9 % bolus 1,000 mL (1,000 mLs Intravenous New Bag/Given 12/27/19 4458)     ED Discharge Orders    None      *Please note:  Kim Potter was evaluated in Emergency Department on 12/27/2019 for the symptoms described in the history of present illness. She was evaluated in the context of the global COVID-19 pandemic, which necessitated consideration that the patient might be at risk for infection with the SARS-CoV-2 virus that causes COVID-19. Institutional protocols and algorithms that pertain to the evaluation of patients at risk for COVID-19 are in a state of rapid change based on information released by regulatory bodies including the  CDC and federal and state organizations. These policies and algorithms were followed during the patient's care in the ED.  Some ED evaluations and interventions may be delayed as a result of limited staffing during and after the pandemic.*  Note:  This document was prepared using Dragon voice recognition software and may include unintentional dictation errors.   Hinda Kehr, MD 12/27/19 [phone]

## 2019-12-27 NOTE — ED Notes (Signed)
Patient drank the three bottles of gastrografin and tolerated well

## 2019-12-27 NOTE — ED Notes (Signed)
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 MD MADE AWARE.

## 2019-12-27 NOTE — H&P (Signed)
Subjective:   CC: SBO  HPI:  Kim Potter is a 69 y.o. female who was consulted by [place] for issue above.  Symptoms were first noted 1 month ago. Pain is sharp, generalized abdominal pain.  Initially was improving after food intake, but the past couple days the pain was persistent and had a couple episodes of emesis so decided to present to the ED.  Went to [facility] initially where work-up was negative and sent home, due to the persistent symptoms came to [facility] for a second opinion.  CT and work-up at that time showed a bowel obstruction.  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  Her last bowel movement that was reported to me was yesterday night.  Recently has not been passing flatus.   Past Medical History:  has a past medical history of Hyperlipidemia and Hypertension.  Laryngeal cancer  Past Surgical History:  Past Surgical History:  Procedure Laterality Date  . PARTIAL HYSTERECTOMY      Family History: family history includes Diabetes in her brother; Kidney disease in her brother.  Social History:  reports that she has never smoked. She has never used smokeless tobacco. She reports that she does not drink alcohol and does not use drugs.  Current Medications:  amLODipine (NORVASC) 10 MG tablet Take 0.5 tablets (5 mg total) by mouth daily. Lysle Pearl, Gulianna Hornsby, DO Reordered  Ordered as: amLODipine (NORVASC) tablet 2.5 mg - 2.5 mg, Oral, Daily, First dose on Wed 12/27/19 at 1000  famotidine (PEPCID) 40 MG tablet Take 40 mg by mouth every evening.  Lysle Pearl, Charletta Voight, DO Reordered  Ordered as: famotidine (PEPCID) tablet 40 mg - 40 mg, Oral, Every evening, First dose on Wed 12/27/19 at 1800  fexofenadine (ALLEGRA) 180 MG tablet Take by mouth. Lysle Pearl, Krystena Reitter, DO Reordered  Ordered as: loratadine (CLARITIN) tablet 10 mg - 10 mg, Oral, Daily, First dose on Wed 12/27/19 at 1000  fluticasone (FLONASE) 50 MCG/ACT  nasal spray Place 1 spray into both nostrils daily. Lysle Pearl, Nastashia Gallo, DO Reordered  Ordered as: fluticasone (FLONASE) 50 MCG/ACT nasal spray 1 spray - 1 spray, Each Nare, Daily, First dose on Wed 12/27/19 at 1000  metoprolol succinate (TOPROL-XL) 50 MG 24 hr tablet Take 50 mg by mouth daily.  Lysle Pearl, Aaliyan Brinkmeier, DO Reordered  Ordered as: metoprolol succinate (TOPROL-XL) 24 hr tablet 50 mg - 50 mg, Oral, Daily, First dose on Wed 12/27/19 at 1000  polyethylene glycol powder (GLYCOLAX/MIRALAX) 17 GM/SCOOP powder Take as directed per instruction sheet from [facility] Provider. Lysle Pearl, Jahni Paul, DO Not Ordered  simethicone (MYLICON) 093 MG chewable tablet Chew by mouth. Lysle Pearl, Cara Aguino, DO Not Ordered  simvastatin (ZOCOR) 20 MG tablet Take 20 mg by mouth at bedtime.  Lysle Pearl, Debi Cousin, DO Reordered  Ordered as: simvastatin (ZOCOR) tablet 20 mg - 20 mg, Oral, Daily-1800, First dose on Wed 12/27/19 at 1800  sucralfate (CARAFATE) 1 g tablet Take by mouth. Lysle Pearl, Kilian Schwartz, DO Reordered  Ordered as: sucralfate (CARAFATE) tablet 1 g - 1 g, Oral, 2 times daily, First dose on Wed 12/27/19 at 1000  triamterene-hydrochlorothiazide (MAXZIDE-25) 37.5-25 MG tablet Take 1 tablet by mouth daily.  Lysle Pearl, Ermel Verne, DO Reordered  Ordered as: triamterene-hydrochlorothiazide (MAXZIDE-25) 37.5-25 MG per tablet 1 tablet - 1 tablet, Oral, Daily, First dose on Wed 12/27/19 at 1000  acetaminophen (TYLENOL) 160 MG/5ML suspension Take by mouth. Benjamine Sprague, DO Not Ordered   Patient not taking: Reported on 12/27/2019  aspirin EC 81 MG tablet Take 81 mg by mouth daily. Benjamine Sprague, DO Not Ordered     Allergies:  Allergies as of 12/26/2019 - Review Complete 12/26/2019  Allergen Reaction Noted  . Penicillins Itching and Rash 01/08/2016    ROS:  General: Denies weight loss, weight gain, fatigue, fevers, chills, and night sweats. Eyes: Denies blurry vision, double vision, eye pain, itchy eyes, and tearing. Ears: Denies hearing loss, earache, and ringing in  ears. Nose: Denies sinus pain, congestion, infections, runny nose, and nosebleeds. Mouth/throat: Denies hoarseness, sore throat, bleeding gums, and difficulty swallowing. Heart: Denies chest pain, palpitations, racing heart, irregular heartbeat, leg pain or swelling, and decreased activity tolerance. Respiratory: Denies breathing difficulty, shortness of breath, wheezing, cough, and sputum. GI: Denies constipation, diarrhea, and blood in stool. GU: Denies difficulty urinating, pain with urinating, urgency, frequency, blood in urine. Musculoskeletal: Denies joint stiffness, pain, swelling, muscle weakness. Skin: Denies rash, itching, mass, tumors, sores, and boils Neurologic: Denies headache, fainting, dizziness, seizures, numbness, and tingling. Psychiatric: Denies depression, anxiety, difficulty sleeping, and memory loss. Endocrine: Denies heat or cold intolerance, and increased thirst or urination. Blood/lymph: Denies easy bruising, easy bruising, and swollen glands     Objective:     BP 136/82 (BP Location: Right Arm)   Pulse 71   Temp 98.3 F (36.8 C)   Resp 16   Ht 5\' 1"  (1.549 m)   Wt 48.5 kg   LMP 01/08/1979 (Approximate)   SpO2 100%   BMI 20.22 kg/m   Constitutional :  alert, cooperative, appears stated age and no distress  Lymphatics/Throat:  no asymmetry, masses, or scars  Respiratory:  clear to auscultation bilaterally  Cardiovascular:  regular rate and rhythm  Gastrointestinal: soft, non-tender; bowel sounds normal; no masses,  no organomegaly.   Musculoskeletal: Steady movement  Skin: Cool and moist   Psychiatric: Normal affect, non-agitated, not confused       LABS:  CMP Latest Ref Rng & Units 12/26/2019 03/30/2017  Glucose 70 - 99 mg/dL 141(H) 127(H)  BUN 8 - 23 mg/dL 16 8  Creatinine 0.44 - 1.00 mg/dL 0.96 0.85  Sodium 135 - 145 mmol/L 138 142  Potassium 3.5 - 5.1 mmol/L 3.8 3.5  Chloride 98 - 111 mmol/L 99 104  CO2 22 - 32 mmol/L 26 28  Calcium 8.9 -  10.3 mg/dL 9.8 9.4  Total Protein 6.5 - 8.1 g/dL 8.4(H) -  Total Bilirubin 0.3 - 1.2 mg/dL 0.9 -  Alkaline Phos 38 - 126 U/L 64 -  AST 15 - 41 U/L 26 -  ALT 0 - 44 U/L 25 -   CBC Latest Ref Rng & Units 12/26/2019 03/30/2017  WBC 4.0 - 10.5 K/uL 10.0 7.3  Hemoglobin 12.0 - 15.0 g/dL 13.2 12.8  Hematocrit 36 - 46 % 38.0 39.7  Platelets 150 - 400 K/uL 170 200    RADS: CLINICAL DATA:  Diverticulitis suspected.  Nausea and vomiting  EXAM: CT ABDOMEN AND PELVIS WITH CONTRAST  TECHNIQUE: Multidetector CT imaging of the abdomen and pelvis was performed using the standard protocol following bolus administration of intravenous contrast.  CONTRAST:  71mL OMNIPAQUE IOHEXOL 300 MG/ML  SOLN  COMPARISON:  None.  FINDINGS: Lower chest:  No contributory findings.  Hepatobiliary: No focal liver abnormality.Contrast in the nondilated gallbladder.  Pancreas: Unremarkable.  Spleen: Unremarkable.  Adrenals/Urinary Tract: Negative adrenals. No hydronephrosis or ureteral stone. Punctate lower pole calculus on the left. Bilateral renal cystic densities measuring up to 2.5 cm on the left.  Contrast in the urinary bladder from outside procedure.  Stomach/Bowel: Small bowel obstruction with twisting of loops and mesenteric distortion in the deep left paramedian abdomen as marked on axial and coronal images. The distal small bowel is decompressed there is mild colonic stool retention. The stomach and proximal small bowel is also decompressed. No evidence of bowel perforation or necrosis. No evident underlying inflammation.  Vascular/Lymphatic: Diffuse atheromatous wall thickening of the aorta and iliacs. No mass or adenopathy.  Reproductive:Hysterectomy. 2.8 cm cystic density in the right ovary with simple appearance.  Other: No ascites or pneumoperitoneum.  Musculoskeletal: No acute abnormalities. Lumbar facet osteoarthritis with mild L3-4 and L4-5  anterolisthesis.  IMPRESSION: 1. High-grade small bowel obstruction with bowel loop twisting. This finding was not present/noted on an outside abdominal CT report from earlier today. Both proximal and distal loops are decompressed. 2. 2.8 cm right ovarian cyst which could be characterized by outpatient ultrasound.   Electronically Signed   By: Monte Fantasia M.D.   On: 12/27/2019 04:55 Assessment:   SBO.  Plan:   Direct review of the images by myself concurs with the assessment, with possible bowel twisting.  However, this was not present less than 24 hours ago on a different CT despite the symptoms seeming to last these past months.  With the normal white count and a lactic acid level also within normal limits, I do not believe this is a emergent surgical issue and would potentially resolve with expectant management at this time.  We will proceed with small bowel follow-through to assess over time.  Due to history of laryngeal cancer we will forego any NG tube placement at this time since she is very comfortable, her abdomen is not distended whatsoever despite her low BMI.  We will continue to monitor closely.

## 2019-12-28 ENCOUNTER — Inpatient Hospital Stay: Payer: Medicare HMO

## 2019-12-28 LAB — BASIC METABOLIC PANEL
Anion gap: 13 (ref 5–15)
BUN: 23 mg/dL (ref 8–23)
CO2: 27 mmol/L (ref 22–32)
Calcium: 9.3 mg/dL (ref 8.9–10.3)
Chloride: 102 mmol/L (ref 98–111)
Creatinine, Ser: 0.94 mg/dL (ref 0.44–1.00)
GFR calc Af Amer: >60 mL/min (ref >60)
GFR calc non Af Amer: >60 mL/min (ref >60)
Glucose, Bld: 134 mg/dL — ABNORMAL HIGH (ref 70–99)
Potassium: 3.9 mmol/L (ref 3.5–5.1)
Sodium: 142 mmol/L (ref 135–145)

## 2019-12-28 LAB — CBC
HCT: 37 % (ref 36.0–46.0)
Hemoglobin: 12.3 g/dL (ref 12.0–15.0)
MCH: 23.5 pg — ABNORMAL LOW (ref 26.0–34.0)
MCHC: 33.2 g/dL (ref 30.0–36.0)
MCV: 70.7 fL — ABNORMAL LOW (ref 80.0–100.0)
Platelets: 180 10*3/uL (ref 150–400)
RBC: 5.23 MIL/uL — ABNORMAL HIGH (ref 3.87–5.11)
RDW: 15.4 % (ref 11.5–15.5)
WBC: 8.9 10*3/uL (ref 4.0–10.5)
nRBC: 0 % (ref 0.0–0.2)

## 2019-12-28 LAB — MAGNESIUM: Magnesium: 2.3 mg/dL (ref 1.7–2.4)

## 2019-12-28 LAB — PHOSPHORUS: Phosphorus: 4.1 mg/dL (ref 2.5–4.6)

## 2019-12-28 MED ORDER — HYDROCODONE-ACETAMINOPHEN 7.5-325 MG/15ML PO SOLN: 10.0000 mL | ORAL | Status: DC | PRN

## 2019-12-28 MED ORDER — HYDROCODONE-ACETAMINOPHEN 7.5-325 MG/15ML PO SOLN
20.0000 mL | ORAL | Status: DC | PRN
  Administered 2019-12-28 (×2): 20 mL via ORAL
  Filled 2019-12-28 (×2): qty 30

## 2019-12-28 NOTE — Progress Notes (Addendum)
Subjective:  CC: Kim Potter is a 69 y.o. female  Hospital stay day 1,  SBO  HPI: Couple more episodes of emesis.  Pt does states she is passing small amount of flatus yesterday night and this am.  ROS:  General: Denies weight loss, weight gain, fatigue, fevers, chills, and night sweats. Heart: Denies chest pain, palpitations, racing heart, irregular heartbeat, leg pain or swelling, and decreased activity tolerance. Respiratory: Denies breathing difficulty, shortness of breath, wheezing, cough, and sputum. GI: Denies change in appetite, heartburn, nausea, vomiting, constipation, diarrhea, and blood in stool. GU: Denies difficulty urinating, pain with urinating, urgency, frequency, blood in urine.   Objective:   Temp:  [98.2 F (36.8 C)-98.7 F (37.1 C)] 98.7 F (37.1 C) (07/01 0551) Pulse Rate:  [56-90] 69 (07/01 0551) Resp:  [9-22] 18 (07/01 0551) BP: (116-145)/(61-82) 139/80 (07/01 0551) SpO2:  [97 %-100 %] 100 % (07/01 0551)     Height: 5\' 1"  (154.9 cm) Weight: 48.5 kg BMI (Calculated): 20.23   Intake/Output this shift:   Intake/Output Summary (Last 24 hours) at 12/28/2019 1036 Last data filed at 12/28/2019 0406 Gross per 24 hour  Intake 1525.72 ml  Output --  Net 1525.72 ml    Constitutional :  alert, cooperative, appears stated age and no distress  Respiratory:  clear to auscultation bilaterally  Cardiovascular:  regular rate and rhythm  Gastrointestinal: soft, no guarding, but continues distention noted.   Skin: Cool and moist.   Psychiatric: Normal affect, non-agitated, not confused       LABS:  CMP Latest Ref Rng & Units 12/28/2019 12/26/2019 03/30/2017  Glucose 70 - 99 mg/dL 134(H) 141(H) 127(H)  BUN 8 - 23 mg/dL 23 16 8   Creatinine 0.44 - 1.00 mg/dL 0.94 0.96 0.85  Sodium 135 - 145 mmol/L 142 138 142  Potassium 3.5 - 5.1 mmol/L 3.9 3.8 3.5  Chloride 98 - 111 mmol/L 102 99 104  CO2 22 - 32 mmol/L 27 26 28   Calcium 8.9 - 10.3 mg/dL 9.3 9.8 9.4  Total Protein 6.5  - 8.1 g/dL - 8.4(H) -  Total Bilirubin 0.3 - 1.2 mg/dL - 0.9 -  Alkaline Phos 38 - 126 U/L - 64 -  AST 15 - 41 U/L - 26 -  ALT 0 - 44 U/L - 25 -   CBC Latest Ref Rng & Units 12/28/2019 12/26/2019 03/30/2017  WBC 4.0 - 10.5 K/uL 8.9 10.0 7.3  Hemoglobin 12.0 - 15.0 g/dL 12.3 13.2 12.8  Hematocrit 36 - 46 % 37.0 38.0 39.7  Platelets 150 - 400 K/uL 180 170 200    RADS: CLINICAL DATA:  Small-bowel obstruction.  EXAM: ABDOMEN - 1 VIEW  COMPARISON:  Abdomen 12/27/2019.  CT 12/27/2019.  FINDINGS: Persistent prominent small bowel distention with persistent contrast noted in the stomach and small bowel. Findings consistent with persistent small-bowel obstruction. No free air identified. Contrast noted in the bladder from prior CT. Diffuse osteopenia. Degenerative changes lumbar spine.  IMPRESSION: Persistent prominent small bowel distention with persistent contrast in the stomach and small bowel. Similar findings noted on prior exam. Findings consistent with persistent small-bowel obstruction. No free air identified.   Electronically Signed   By: Marcello Moores  Register   On: 12/28/2019 06:01 Assessment:   SBO.  With persistent contrast noted within the stomach and barely advancing beyond, as well as the continued episodes of emesis, I recommended that we proceed with exploratory laparotomy and lysis of adhesions since this will likely not resolve on its own.  Small amount of flatus was reassuring but the overall clinical picture still points towards persistent small bowel obstruction that will likely not resolve.  Discussed surgery with patient and family members and they are agreeable to proceeding.  The risk of surgery include, but not limited to, recurrence, bleeding, chronic pain, post-op infxn, post-op SBO or ileus, hernias, resection of bowel, re-anastamosis, possible ostomy placement and need for re-operation to address said risks. The risks of general anesthetic, if used,  includes MI, CVA, sudden death or even reaction to anesthetic medications also discussed. Alternatives include continued observation and NG decompression.  Benefits include possible symptom relief, preventing further decline in health and possible death.  Typical post-op recovery time of additional days in hospital for observation afterwards also discussed.  The patient verbalized understanding and all questions were answered to the patient's satisfaction.  ADDENDUM: Notified that patient ate apple sauce shortly before OR.  Will proceed with surgery later

## 2019-12-28 NOTE — H&P (View-Only) (Signed)
Subjective:  CC: Kim Potter is a 69 y.o. female  Hospital stay day 1,  SBO  HPI: Couple more episodes of emesis.  Pt does states she is passing small amount of flatus yesterday night and this am.  ROS:  General: Denies weight loss, weight gain, fatigue, fevers, chills, and night sweats. Heart: Denies chest pain, palpitations, racing heart, irregular heartbeat, leg pain or swelling, and decreased activity tolerance. Respiratory: Denies breathing difficulty, shortness of breath, wheezing, cough, and sputum. GI: Denies change in appetite, heartburn, nausea, vomiting, constipation, diarrhea, and blood in stool. GU: Denies difficulty urinating, pain with urinating, urgency, frequency, blood in urine.   Objective:   Temp:  [98.2 F (36.8 C)-98.7 F (37.1 C)] 98.7 F (37.1 C) (07/01 0551) Pulse Rate:  [56-90] 69 (07/01 0551) Resp:  [9-22] 18 (07/01 0551) BP: (116-145)/(61-82) 139/80 (07/01 0551) SpO2:  [97 %-100 %] 100 % (07/01 0551)     Height: 5\' 1"  (154.9 cm) Weight: 48.5 kg BMI (Calculated): 20.23   Intake/Output this shift:   Intake/Output Summary (Last 24 hours) at 12/28/2019 1036 Last data filed at 12/28/2019 0406 Gross per 24 hour  Intake 1525.72 ml  Output --  Net 1525.72 ml    Constitutional :  alert, cooperative, appears stated age and no distress  Respiratory:  clear to auscultation bilaterally  Cardiovascular:  regular rate and rhythm  Gastrointestinal: soft, no guarding, but continues distention noted.   Skin: Cool and moist.   Psychiatric: Normal affect, non-agitated, not confused       LABS:  CMP Latest Ref Rng & Units 12/28/2019 12/26/2019 03/30/2017  Glucose 70 - 99 mg/dL 134(H) 141(H) 127(H)  BUN 8 - 23 mg/dL 23 16 8   Creatinine 0.44 - 1.00 mg/dL 0.94 0.96 0.85  Sodium 135 - 145 mmol/L 142 138 142  Potassium 3.5 - 5.1 mmol/L 3.9 3.8 3.5  Chloride 98 - 111 mmol/L 102 99 104  CO2 22 - 32 mmol/L 27 26 28   Calcium 8.9 - 10.3 mg/dL 9.3 9.8 9.4  Total Protein 6.5  - 8.1 g/dL - 8.4(H) -  Total Bilirubin 0.3 - 1.2 mg/dL - 0.9 -  Alkaline Phos 38 - 126 U/L - 64 -  AST 15 - 41 U/L - 26 -  ALT 0 - 44 U/L - 25 -   CBC Latest Ref Rng & Units 12/28/2019 12/26/2019 03/30/2017  WBC 4.0 - 10.5 K/uL 8.9 10.0 7.3  Hemoglobin 12.0 - 15.0 g/dL 12.3 13.2 12.8  Hematocrit 36 - 46 % 37.0 38.0 39.7  Platelets 150 - 400 K/uL 180 170 200    RADS: CLINICAL DATA:  Small-bowel obstruction.  EXAM: ABDOMEN - 1 VIEW  COMPARISON:  Abdomen 12/27/2019.  CT 12/27/2019.  FINDINGS: Persistent prominent small bowel distention with persistent contrast noted in the stomach and small bowel. Findings consistent with persistent small-bowel obstruction. No free air identified. Contrast noted in the bladder from prior CT. Diffuse osteopenia. Degenerative changes lumbar spine.  IMPRESSION: Persistent prominent small bowel distention with persistent contrast in the stomach and small bowel. Similar findings noted on prior exam. Findings consistent with persistent small-bowel obstruction. No free air identified.   Electronically Signed   By: Marcello Moores  Register   On: 12/28/2019 06:01 Assessment:   SBO.  With persistent contrast noted within the stomach and barely advancing beyond, as well as the continued episodes of emesis, I recommended that we proceed with exploratory laparotomy and lysis of adhesions since this will likely not resolve on its own.  Small amount of flatus was reassuring but the overall clinical picture still points towards persistent small bowel obstruction that will likely not resolve.  Discussed surgery with patient and family members and they are agreeable to proceeding.  The risk of surgery include, but not limited to, recurrence, bleeding, chronic pain, post-op infxn, post-op SBO or ileus, hernias, resection of bowel, re-anastamosis, possible ostomy placement and need for re-operation to address said risks. The risks of general anesthetic, if used,  includes MI, CVA, sudden death or even reaction to anesthetic medications also discussed. Alternatives include continued observation and NG decompression.  Benefits include possible symptom relief, preventing further decline in health and possible death.  Typical post-op recovery time of additional days in hospital for observation afterwards also discussed.  The patient verbalized understanding and all questions were answered to the patient's satisfaction.  ADDENDUM: Notified that patient ate apple sauce shortly before OR.  Will proceed with surgery later

## 2019-12-28 NOTE — Progress Notes (Signed)
Patient ambulated about 640 feet in hallway and tolerated it well.  Her pain has been about a 5 out of 10. Will continue to monitor.  Kim Potter

## 2019-12-28 NOTE — Progress Notes (Signed)
Initial Nutrition Assessment  DOCUMENTATION CODES:   Non-severe (moderate) malnutrition in context of chronic illness  INTERVENTION:   RD will monitor for diet advancement vs the need for nutrition support  NUTRITION DIAGNOSIS:   Moderate Malnutrition related to cancer and cancer related treatments as evidenced by 21 percent weight loss in 1 year, mild to moderate fat depletions, mild to moderate muscle depletions.  GOAL:   Patient will meet greater than or equal to 90% of their needs  MONITOR:   Diet advancement, Labs, Weight trends, Skin, I & O's  REASON FOR ASSESSMENT:   Consult Assessment of nutrition requirement/status  ASSESSMENT:   69 y/o female with h/o HTN, GERD and laryngeal cancer s/p chemo who is admitted with SBO   Met with pt in room today. Pt reports intermittent poor appetite and oral intake over the past year r/t laryngeal cancer. Pt reports that recently, she had been eating very well up until she developed nausea and abdominal pain. Pt reports that she has not eaten anything in 3 days pta. Pt reports abdominal pain, nausea and emesis today. Pt reports that she is willing to drink strawberry Ensure once her diet advances. RD will monitor for diet advancement vs the need for nutrition support.   Per chart, pt is down 29lbs(21%) over the past year; this is significant.   Medications reviewed and include: pepcid, carafate, LRS '@75ml'$ /hr  Labs reviewed: K 3.9 wnl, P 4.1 wnl, Mg 2.3 wnl  NUTRITION - FOCUSED PHYSICAL EXAM:    Most Recent Value  Orbital Region No depletion  Upper Arm Region Moderate depletion  Thoracic and Lumbar Region Mild depletion  Buccal Region Mild depletion  Temple Region Mild depletion  Clavicle Bone Region Mild depletion  Clavicle and Acromion Bone Region Mild depletion  Scapular Bone Region Mild depletion  Dorsal Hand Mild depletion  Patellar Region Moderate depletion  Anterior Thigh Region Moderate depletion  Posterior Calf  Region Moderate depletion  Edema (RD Assessment) None  Hair Reviewed  Eyes Reviewed  Mouth Reviewed  Skin Reviewed  Nails Reviewed     Diet Order:   Diet Order            Diet NPO time specified Except for: Sips with Meds  Diet effective now                EDUCATION NEEDS:   Education needs have been addressed  Skin:  Skin Assessment: Reviewed RN Assessment  Last BM:  6/29  Height:   Ht Readings from Last 1 Encounters:  12/26/19 '5\' 1"'$  (1.549 m)    Weight:   Wt Readings from Last 1 Encounters:  12/26/19 48.5 kg    Ideal Body Weight:  47.7 kg  BMI:  Body mass index is 20.22 kg/m.  Estimated Nutritional Needs:   Kcal:  1300-1500kcal/day  Protein:  65-75g/day  Fluid:  >1.3L/day  Koleen Distance MS, RD, LDN Please refer to [facility] for RD and/or RD on-call/weekend/after hours pager

## 2019-12-29 ENCOUNTER — Inpatient Hospital Stay: Payer: Medicare HMO | Admitting: Anesthesiology

## 2019-12-29 ENCOUNTER — Encounter: Payer: Self-pay | Admitting: Surgery

## 2019-12-29 ENCOUNTER — Encounter
Admission: EM | Disposition: A | Discharge status: Home / Self Care | Payer: Self-pay | Source: Home / Self Care | Attending: Surgery

## 2019-12-29 DIAGNOSIS — E440 Moderate protein-calorie malnutrition: Secondary | ICD-10-CM | POA: Insufficient documentation

## 2019-12-29 HISTORY — PX: LYSIS OF ADHESION: SHX5961

## 2019-12-29 HISTORY — PX: LAPAROTOMY: SHX154

## 2019-12-29 LAB — BASIC METABOLIC PANEL
Anion gap: 10 (ref 5–15)
BUN: 25 mg/dL — ABNORMAL HIGH (ref 8–23)
CO2: 32 mmol/L (ref 22–32)
Calcium: 9.6 mg/dL (ref 8.9–10.3)
Chloride: 98 mmol/L (ref 98–111)
Creatinine, Ser: 0.99 mg/dL (ref 0.44–1.00)
GFR calc Af Amer: >60 mL/min (ref >60)
GFR calc non Af Amer: 59 mL/min — ABNORMAL LOW (ref >60)
Glucose, Bld: 102 mg/dL — ABNORMAL HIGH (ref 70–99)
Potassium: 3.6 mmol/L (ref 3.5–5.1)
Sodium: 140 mmol/L (ref 135–145)

## 2019-12-29 LAB — CBC
HCT: 37.3 % (ref 36.0–46.0)
Hemoglobin: 12.8 g/dL (ref 12.0–15.0)
MCH: 23.8 pg — ABNORMAL LOW (ref 26.0–34.0)
MCHC: 34.3 g/dL (ref 30.0–36.0)
MCV: 69.5 fL — ABNORMAL LOW (ref 80.0–100.0)
Platelets: 183 10*3/uL (ref 150–400)
RBC: 5.37 MIL/uL — ABNORMAL HIGH (ref 3.87–5.11)
RDW: 15.6 % — ABNORMAL HIGH (ref 11.5–15.5)
WBC: 9.4 10*3/uL (ref 4.0–10.5)
nRBC: 0 % (ref 0.0–0.2)

## 2019-12-29 LAB — PHOSPHORUS: Phosphorus: 3.5 mg/dL (ref 2.5–4.6)

## 2019-12-29 LAB — MAGNESIUM: Magnesium: 2.1 mg/dL (ref 1.7–2.4)

## 2019-12-29 SURGERY — LAPAROTOMY, EXPLORATORY
Anesthesia: General

## 2019-12-29 MED ORDER — BUPIVACAINE HCL (PF) 0.5 % IJ SOLN
INTRAMUSCULAR | Status: AC
  Filled 2019-12-29: qty 30

## 2019-12-29 MED ORDER — DEXAMETHASONE SODIUM PHOSPHATE 10 MG/ML IJ SOLN
INTRAMUSCULAR | Status: DC | PRN
  Administered 2019-12-29: 10 mg via INTRAVENOUS

## 2019-12-29 MED ORDER — BUPIVACAINE-EPINEPHRINE (PF) 0.5% -1:200000 IJ SOLN
INTRAMUSCULAR | Status: AC
  Filled 2019-12-29: qty 30

## 2019-12-29 MED ORDER — ROCURONIUM BROMIDE 100 MG/10ML IV SOLN
INTRAVENOUS | Status: DC | PRN
  Administered 2019-12-29: 40 mg via INTRAVENOUS

## 2019-12-29 MED ORDER — ENOXAPARIN SODIUM 40 MG/0.4ML ~~LOC~~ SOLN
40.0000 mg | SUBCUTANEOUS | Status: DC
  Filled 2019-12-29 (×4): qty 0.4

## 2019-12-29 MED ORDER — ROCURONIUM BROMIDE 10 MG/ML (PF) SYRINGE
PREFILLED_SYRINGE | INTRAVENOUS | Status: AC
  Filled 2019-12-29: qty 10

## 2019-12-29 MED ORDER — MIDAZOLAM HCL 2 MG/2ML IJ SOLN
INTRAMUSCULAR | Status: AC
  Filled 2019-12-29: qty 2

## 2019-12-29 MED ORDER — CLINDAMYCIN PHOSPHATE 600 MG/50ML IV SOLN
INTRAVENOUS | Status: AC
  Filled 2019-12-29: qty 50

## 2019-12-29 MED ORDER — CLINDAMYCIN PHOSPHATE 600 MG/50ML IV SOLN
INTRAVENOUS | Status: DC | PRN
  Administered 2019-12-29: 600 mg via INTRAVENOUS

## 2019-12-29 MED ORDER — EPHEDRINE 5 MG/ML INJ
INTRAVENOUS | Status: AC
  Filled 2019-12-29: qty 10

## 2019-12-29 MED ORDER — PHENYLEPHRINE HCL (PRESSORS) 10 MG/ML IV SOLN
INTRAVENOUS | Status: DC | PRN
  Administered 2019-12-29: 200 ug via INTRAVENOUS
  Administered 2019-12-29: 100 ug via INTRAVENOUS
  Administered 2019-12-29: 200 ug via INTRAVENOUS
  Administered 2019-12-29: 100 ug via INTRAVENOUS

## 2019-12-29 MED ORDER — PROPOFOL 10 MG/ML IV BOLUS
INTRAVENOUS | Status: DC | PRN
  Administered 2019-12-29: 80 mg via INTRAVENOUS

## 2019-12-29 MED ORDER — BUPIVACAINE LIPOSOME 1.3 % IJ SUSP
INTRAMUSCULAR | Status: DC | PRN
  Administered 2019-12-29: 20 mL

## 2019-12-29 MED ORDER — PHENYLEPHRINE HCL (PRESSORS) 10 MG/ML IV SOLN
INTRAVENOUS | Status: AC
  Filled 2019-12-29: qty 1

## 2019-12-29 MED ORDER — LIDOCAINE HCL (CARDIAC) PF 100 MG/5ML IV SOSY
PREFILLED_SYRINGE | INTRAVENOUS | Status: DC | PRN
  Administered 2019-12-29: 50 mg via INTRAVENOUS

## 2019-12-29 MED ORDER — GLYCOPYRROLATE 0.2 MG/ML IJ SOLN
INTRAMUSCULAR | Status: AC
  Filled 2019-12-29: qty 1

## 2019-12-29 MED ORDER — FENTANYL CITRATE (PF) 100 MCG/2ML IJ SOLN
INTRAMUSCULAR | Status: DC | PRN
  Administered 2019-12-29 (×2): 50 ug via INTRAVENOUS

## 2019-12-29 MED ORDER — LIDOCAINE HCL (PF) 2 % IJ SOLN
INTRAMUSCULAR | Status: AC
  Filled 2019-12-29: qty 5

## 2019-12-29 MED ORDER — ONDANSETRON HCL 4 MG/2ML IJ SOLN
INTRAMUSCULAR | Status: DC | PRN
  Administered 2019-12-29: 4 mg via INTRAVENOUS

## 2019-12-29 MED ORDER — EPHEDRINE SULFATE 50 MG/ML IJ SOLN
INTRAMUSCULAR | Status: DC | PRN
  Administered 2019-12-29: 5 mg via INTRAVENOUS

## 2019-12-29 MED ORDER — FENTANYL CITRATE (PF) 100 MCG/2ML IJ SOLN: 25.0000 ug | INTRAMUSCULAR | Status: DC | PRN

## 2019-12-29 MED ORDER — SUGAMMADEX SODIUM 200 MG/2ML IV SOLN
INTRAVENOUS | Status: DC | PRN
  Administered 2019-12-29: 200 mg via INTRAVENOUS

## 2019-12-29 MED ORDER — SODIUM CHLORIDE FLUSH 0.9 % IV SOLN
INTRAVENOUS | Status: AC
  Filled 2019-12-29: qty 50

## 2019-12-29 MED ORDER — MIDAZOLAM HCL 2 MG/2ML IJ SOLN
INTRAMUSCULAR | Status: DC | PRN
  Administered 2019-12-29: 1 mg via INTRAVENOUS

## 2019-12-29 MED ORDER — FENTANYL CITRATE (PF) 100 MCG/2ML IJ SOLN
INTRAMUSCULAR | Status: AC
  Filled 2019-12-29: qty 2

## 2019-12-29 MED ORDER — BUPIVACAINE-EPINEPHRINE 0.5% -1:200000 IJ SOLN
INTRAMUSCULAR | Status: DC | PRN
  Administered 2019-12-29: 30 mL

## 2019-12-29 MED ORDER — PROPOFOL 10 MG/ML IV BOLUS
INTRAVENOUS | Status: AC
  Filled 2019-12-29: qty 80

## 2019-12-29 MED ORDER — BUPIVACAINE LIPOSOME 1.3 % IJ SUSP
INTRAMUSCULAR | Status: AC
  Filled 2019-12-29: qty 20

## 2019-12-29 MED ORDER — ONDANSETRON HCL 4 MG/2ML IJ SOLN: 4.0000 mg | Freq: Once | INTRAMUSCULAR | Status: DC | PRN

## 2019-12-29 MED ORDER — SODIUM CHLORIDE (PF) 0.9 % IJ SOLN
INTRAMUSCULAR | Status: DC | PRN
  Administered 2019-12-29: 30 mL

## 2019-12-29 SURGICAL SUPPLY — 35 items
CANISTER SUCT 1200ML W/VALVE (MISCELLANEOUS) ×3 IMPLANT
CHLORAPREP W/TINT 26 (MISCELLANEOUS) ×3 IMPLANT
COVER WAND RF STERILE (DRAPES) ×3 IMPLANT
DRAPE LAPAROTOMY 100X77 ABD (DRAPES) ×3 IMPLANT
DRSG OPSITE POSTOP 4X10 (GAUZE/BANDAGES/DRESSINGS) ×1 IMPLANT
DRSG OPSITE POSTOP 4X8 (GAUZE/BANDAGES/DRESSINGS) ×3 IMPLANT
DRSG TEGADERM 4X10 (GAUZE/BANDAGES/DRESSINGS) ×1 IMPLANT
DRSG TELFA 3X8 NADH (GAUZE/BANDAGES/DRESSINGS) IMPLANT
ELECT BLADE 6.5 EXT (BLADE) ×2 IMPLANT
ELECT REM PT RETURN 9FT ADLT (ELECTROSURGICAL) ×3
ELECTRODE REM PT RTRN 9FT ADLT (ELECTROSURGICAL) ×1 IMPLANT
GLOVE BIOGEL PI IND STRL 7.0 (GLOVE) ×1 IMPLANT
GLOVE BIOGEL PI INDICATOR 7.0 (GLOVE) ×6
GLOVE SURG SYN 6.5 ES PF (GLOVE) ×9 IMPLANT
GLOVE SURG SYN 6.5 PF PI (GLOVE) ×1 IMPLANT
GOWN STRL REUS W/ TWL LRG LVL3 (GOWN DISPOSABLE) ×2 IMPLANT
GOWN STRL REUS W/TWL LRG LVL3 (GOWN DISPOSABLE) ×4
KIT TURNOVER KIT A (KITS) ×3 IMPLANT
LABEL OR SOLS (LABEL) ×3 IMPLANT
NEEDLE HYPO 22GX1.5 SAFETY (NEEDLE) ×2 IMPLANT
NS IRRIG 1000ML POUR BTL (IV SOLUTION) ×3 IMPLANT
PACK BASIN MAJOR (MISCELLANEOUS) ×3 IMPLANT
PACK COLON CLEAN CLOSURE (MISCELLANEOUS) ×1 IMPLANT
PAD DRESSING TELFA 3X8 NADH (GAUZE/BANDAGES/DRESSINGS) ×1 IMPLANT
SPONGE LAP 18X18 RF (DISPOSABLE) ×1 IMPLANT
STAPLER SKIN PROX 35W (STAPLE) ×2 IMPLANT
SUT PDS AB 1 TP1 54 (SUTURE) ×6 IMPLANT
SUT SILK 2 0 (SUTURE)
SUT SILK 2-0 18XBRD TIE 12 (SUTURE) ×1 IMPLANT
SUT SILK 3 0 (SUTURE) ×2
SUT SILK 3-0 18XBRD TIE 12 (SUTURE) ×1 IMPLANT
SUT VIC AB 3-0 SH 27 (SUTURE) ×4
SUT VIC AB 3-0 SH 27X BRD (SUTURE) ×2 IMPLANT
SYR 20ML LL LF (SYRINGE) ×4 IMPLANT
TRAY FOLEY MTR SLVR 16FR STAT (SET/KITS/TRAYS/PACK) ×3 IMPLANT

## 2019-12-29 NOTE — Anesthesia Preprocedure Evaluation (Addendum)
Anesthesia Evaluation  Patient identified by MRN, date of birth, ID band Patient awake    Reviewed: Allergy & Precautions, NPO status , Patient's Chart, lab work & pertinent test results  Airway Mallampati: II  TM Distance: >3 FB     Dental  (+) Upper Dentures, Partial Lower   Pulmonary neg pulmonary ROS,    Pulmonary exam normal        Cardiovascular hypertension, Normal cardiovascular exam     Neuro/Psych negative neurological ROS  negative psych ROS   GI/Hepatic Neg liver ROS,   Endo/Other  negative endocrine ROS  Renal/GU negative Renal ROS  negative genitourinary   Musculoskeletal negative musculoskeletal ROS (+)   Abdominal Normal abdominal exam  (+)   Peds negative pediatric ROS (+)  Hematology negative hematology ROS (+)   Anesthesia Other Findings Past Medical History: No date: Hyperlipidemia No date: Hypertension  Reproductive/Obstetrics                            Anesthesia Physical Anesthesia Plan  ASA: II  Anesthesia Plan: General   Post-op Pain Management:    Induction: Intravenous  PONV Risk Score and Plan:   Airway Management Planned: Oral ETT  Additional Equipment:   Intra-op Plan:   Post-operative Plan: Extubation in OR  Informed Consent: I have reviewed the patients History and Physical, chart, labs and discussed the procedure including the risks, benefits and alternatives for the proposed anesthesia with the patient or authorized representative who has indicated his/her understanding and acceptance.       Plan Discussed with: CRNA and Surgeon  Anesthesia Plan Comments:         Anesthesia Quick Evaluation

## 2019-12-29 NOTE — Anesthesia Procedure Notes (Signed)
Procedure Name: Intubation Date/Time: 12/29/2019 7:42 AM Performed by: Leander Rams, CRNA Pre-anesthesia Checklist: Patient identified, Emergency Drugs available, Suction available, Patient being monitored and Timeout performed Patient Re-evaluated:Patient Re-evaluated prior to induction Oxygen Delivery Method: Circle system utilized Preoxygenation: Pre-oxygenation with 100% oxygen Induction Type: IV induction Ventilation: Mask ventilation without difficulty Laryngoscope Size: McGraph and 3 Grade View: Grade I Tube type: Oral Tube size: 6.0 mm Number of attempts: 1 Airway Equipment and Method: Stylet Placement Confirmation: ETT inserted through vocal cords under direct vision,  positive ETCO2,  CO2 detector and breath sounds checked- equal and bilateral Secured at: 19 cm Tube secured with: Tape Dental Injury: Teeth and Oropharynx as per pre-operative assessment

## 2019-12-29 NOTE — Progress Notes (Signed)
   12/29/19 0220  Clinical Encounter Type  Visited With Patient  Visit Type Initial;Spiritual support;Social support  Referral From Patient  Consult/Referral To Chaplain  Responded to Pt phone call to the [facility] phone. Pt said she has not gotten fluid that she needed in her body for two hours. Ch went to room and talked to Pt. Then Ch left room and talked to nurse. Nurse was headed to the room with the fluid. Pt no long want nurse to put fluid in arm. Nurse made arrangements for someone else to but fluid in Pt arm. Nurse also talked to Pt daughter and let her know what was going on with her mother. Ch will follow-up with Pt.

## 2019-12-29 NOTE — Interval H&P Note (Signed)
History and Physical Interval Note:  12/29/2019 6:52 AM  Kim Potter  has presented today for surgery, with the diagnosis of adhesions.  The various methods of treatment have been discussed with the patient and family. After consideration of risks, benefits and other options for treatment, the patient has consented to  Procedure(s): EXPLORATORY LAPAROTOMY (N/A) LYSIS OF ADHESION (N/A) as a surgical intervention.  The patient's history has been reviewed, patient examined, no change in status, stable for surgery.  I have reviewed the patient's chart and labs.  Questions were answered to the patient's satisfaction.     Brigham Cobbins Lysle Pearl

## 2019-12-29 NOTE — Care Management Important Message (Signed)
Important Message  Patient Details  Name: Kim Potter MRN: [id number] Date of Birth: 1951-02-14   Medicare Important Message Given:  Yes  Initial Medicare IM given by Patient Access Associate on 12/28/2019 at 11:18am.     Dannette Barbara 12/29/2019, 8:30 AM

## 2019-12-29 NOTE — Transfer of Care (Signed)
Immediate Anesthesia Transfer of Care Note  Patient: Kim Potter  Procedure(s) Performed: EXPLORATORY LAPAROTOMY (N/A ) LYSIS OF ADHESION (N/A )  Patient Location: PACU  Anesthesia Type:General  Level of Consciousness: awake  Airway & Oxygen Therapy: Patient Spontanous Breathing  Post-op Assessment: Report given to RN  Post vital signs: stable  Last Vitals:  Vitals Value Taken Time  BP 144/86 12/29/19 0900  Temp 35.9 C 12/29/19 0900  Pulse 77 12/29/19 0904  Resp 17 12/29/19 0905  SpO2 98 % 12/29/19 0904  Vitals shown include unvalidated device data.  Last Pain:  Vitals:   12/29/19 0631  TempSrc: Tympanic  PainSc: 4       Patients Stated Pain Goal: 1 (79/48/01 6553)  Complications: No complications documented.

## 2019-12-30 ENCOUNTER — Inpatient Hospital Stay: Payer: Medicare HMO

## 2019-12-30 ENCOUNTER — Encounter: Payer: Self-pay | Admitting: Surgery

## 2019-12-30 LAB — BASIC METABOLIC PANEL
Anion gap: 8 (ref 5–15)
BUN: 24 mg/dL — ABNORMAL HIGH (ref 8–23)
CO2: 29 mmol/L (ref 22–32)
Calcium: 8.4 mg/dL — ABNORMAL LOW (ref 8.9–10.3)
Chloride: 102 mmol/L (ref 98–111)
Creatinine, Ser: 0.81 mg/dL (ref 0.44–1.00)
GFR calc Af Amer: >60 mL/min (ref >60)
GFR calc non Af Amer: >60 mL/min (ref >60)
Glucose, Bld: 94 mg/dL (ref 70–99)
Potassium: 3.5 mmol/L (ref 3.5–5.1)
Sodium: 139 mmol/L (ref 135–145)

## 2019-12-30 LAB — CBC
HCT: 30 % — ABNORMAL LOW (ref 36.0–46.0)
Hemoglobin: 10.4 g/dL — ABNORMAL LOW (ref 12.0–15.0)
MCH: 23.4 pg — ABNORMAL LOW (ref 26.0–34.0)
MCHC: 34.7 g/dL (ref 30.0–36.0)
MCV: 67.6 fL — ABNORMAL LOW (ref 80.0–100.0)
Platelets: 130 10*3/uL — ABNORMAL LOW (ref 150–400)
RBC: 4.44 MIL/uL (ref 3.87–5.11)
RDW: 14.9 % (ref 11.5–15.5)
WBC: 6.1 10*3/uL (ref 4.0–10.5)
nRBC: 0 % (ref 0.0–0.2)

## 2019-12-30 LAB — MAGNESIUM: Magnesium: 1.7 mg/dL (ref 1.7–2.4)

## 2019-12-30 LAB — PHOSPHORUS: Phosphorus: 2.8 mg/dL (ref 2.5–4.6)

## 2019-12-30 NOTE — Progress Notes (Signed)
CC: SBO Subjective: Status post laparotomy yesterday. Doing well. No flatus. KUB personally reviewed and there is contrast in the large intestine. There is also evidence of the tip of the NG tube within the duodenum. Drop in hemoglobin likely dilutional. Objective: Vital signs in last 24 hours: Temp:  [98.3 F (36.8 C)-99.7 F (37.6 C)] 98.8 F (37.1 C) (07/03 1144) Pulse Rate:  [88-106] 95 (07/03 1144) Resp:  [12-18] 12 (07/03 1144) BP: (113-126)/(71-98) 113/98 (07/03 1144) SpO2:  [98 %-100 %] 100 % (07/03 1144) Last BM Date: 12/26/19  Intake/Output from previous day: 07/02 0701 - 07/03 0700 In: 1266.4 [I.V.:1266.4] Out: 1135 [Urine:75; Emesis/NG output:600; Blood:10] Intake/Output this shift: Total I/O In: 478.3 [P.O.:50; I.V.:428.3] Out: 75 [Emesis/NG output:75]  Physical exam: Nad Abd: Dressing intact. Decreased bowel sounds. No evidence of infection or complications.   Lab Results: CBC  Recent Labs    12/29/19 0525 12/30/19 0632  WBC 9.4 6.1  HGB 12.8 10.4*  HCT 37.3 30.0*  PLT 183 130*   BMET Recent Labs    12/29/19 0525 12/30/19 0632  NA 140 139  K 3.6 3.5  CL 98 102  CO2 32 29  GLUCOSE 102* 94  BUN 25* 24*  CREATININE 0.99 0.81  CALCIUM 9.6 8.4*   PT/INR No results for input(s): LABPROT, INR in the last 72 hours. ABG No results for input(s): PHART, HCO3 in the last 72 hours.  Invalid input(s): PCO2, PO2  Studies/Results: DG Abd Portable 2V  Result Date: 12/30/2019 CLINICAL DATA:  Small bowel obstruction 1 day postop exploratory laparotomy with lysis of adhesions. EXAM: PORTABLE ABDOMEN - 2 VIEW COMPARISON:  12/28/2019 FINDINGS: Nasogastric tube is present with tip over the right mid abdomen likely over the distal stomach or duodenal C sweep. Skin staples are present vertically over the lower abdomen/pelvis just left of midline. There is contrast present throughout the colon from patient's recent CT scan. No significant dilated small bowel  loops and no evidence of free peritoneal air. Remainder the exam is unchanged. IMPRESSION: Nonspecific, nonobstructive bowel gas pattern with contrast throughout the colon. Postsurgical change as described. NG tube with tip over the right abdomen. Electronically Signed   By: Marin Olp M.D.   On: 12/30/2019 12:26    Anti-infectives: Anti-infectives (From admission, onward)   None      Assessment/Plan:  POD # 1 Doing well Awaiting return of bowel function. We will pull back the NG to about 45 cm since it is in the duodenum. Likely start clamping trial tomorrow. No immediate surgical complications  Caroleen Hamman, MD, FACS  12/30/2019

## 2019-12-31 LAB — CBC
HCT: 32 % — ABNORMAL LOW (ref 36.0–46.0)
Hemoglobin: 10.4 g/dL — ABNORMAL LOW (ref 12.0–15.0)
MCH: 23.1 pg — ABNORMAL LOW (ref 26.0–34.0)
MCHC: 32.5 g/dL (ref 30.0–36.0)
MCV: 71 fL — ABNORMAL LOW (ref 80.0–100.0)
Platelets: 146 10*3/uL — ABNORMAL LOW (ref 150–400)
RBC: 4.51 MIL/uL (ref 3.87–5.11)
RDW: 15.1 % (ref 11.5–15.5)
WBC: 6.1 10*3/uL (ref 4.0–10.5)
nRBC: 0 % (ref 0.0–0.2)

## 2019-12-31 LAB — BASIC METABOLIC PANEL
Anion gap: 12 (ref 5–15)
BUN: 24 mg/dL — ABNORMAL HIGH (ref 8–23)
CO2: 28 mmol/L (ref 22–32)
Calcium: 8.6 mg/dL — ABNORMAL LOW (ref 8.9–10.3)
Chloride: 102 mmol/L (ref 98–111)
Creatinine, Ser: 0.88 mg/dL (ref 0.44–1.00)
GFR calc Af Amer: >60 mL/min (ref >60)
GFR calc non Af Amer: >60 mL/min (ref >60)
Glucose, Bld: 62 mg/dL — ABNORMAL LOW (ref 70–99)
Potassium: 3.3 mmol/L — ABNORMAL LOW (ref 3.5–5.1)
Sodium: 142 mmol/L (ref 135–145)

## 2019-12-31 LAB — PHOSPHORUS: Phosphorus: 2.7 mg/dL (ref 2.5–4.6)

## 2019-12-31 LAB — MAGNESIUM: Magnesium: 1.9 mg/dL (ref 1.7–2.4)

## 2019-12-31 MED ORDER — POTASSIUM CHLORIDE 10 MEQ/100ML IV SOLN
10.0000 meq | INTRAVENOUS | Status: AC
  Administered 2019-12-31 (×3): 10 meq via INTRAVENOUS
  Filled 2019-12-31: qty 100

## 2019-12-31 NOTE — Progress Notes (Signed)
POD # 2 Doing very well Minimal pain AVSS Passing flatus k a bit low, labs ok otherwise  PE NAD Abd: soft, incisions c/d/i, staples in place, no infection  A/P Doing well DC NGT replace k CLD Ambulate DC 24-48 hrs

## 2020-01-01 LAB — CBC
HCT: 31.5 % — ABNORMAL LOW (ref 36.0–46.0)
Hemoglobin: 10.4 g/dL — ABNORMAL LOW (ref 12.0–15.0)
MCH: 23.3 pg — ABNORMAL LOW (ref 26.0–34.0)
MCHC: 33 g/dL (ref 30.0–36.0)
MCV: 70.6 fL — ABNORMAL LOW (ref 80.0–100.0)
Platelets: 153 10*3/uL (ref 150–400)
RBC: 4.46 MIL/uL (ref 3.87–5.11)
RDW: 14.8 % (ref 11.5–15.5)
WBC: 5.5 10*3/uL (ref 4.0–10.5)
nRBC: 0 % (ref 0.0–0.2)

## 2020-01-01 LAB — BASIC METABOLIC PANEL
Anion gap: 11 (ref 5–15)
BUN: 19 mg/dL (ref 8–23)
CO2: 28 mmol/L (ref 22–32)
Calcium: 8.5 mg/dL — ABNORMAL LOW (ref 8.9–10.3)
Chloride: 102 mmol/L (ref 98–111)
Creatinine, Ser: 0.73 mg/dL (ref 0.44–1.00)
GFR calc Af Amer: >60 mL/min (ref >60)
GFR calc non Af Amer: >60 mL/min (ref >60)
Glucose, Bld: 77 mg/dL (ref 70–99)
Potassium: 3.6 mmol/L (ref 3.5–5.1)
Sodium: 141 mmol/L (ref 135–145)

## 2020-01-01 LAB — PHOSPHORUS: Phosphorus: 3 mg/dL (ref 2.5–4.6)

## 2020-01-01 LAB — MAGNESIUM: Magnesium: 1.8 mg/dL (ref 1.7–2.4)

## 2020-01-01 MED ORDER — MENTHOL 3 MG MT LOZG
1.0000 | LOZENGE | OROMUCOSAL | Status: DC | PRN
  Administered 2020-01-01: 3 mg via ORAL
  Filled 2020-01-01 (×2): qty 9

## 2020-01-01 MED ORDER — ENSURE ENLIVE PO LIQD
237.0000 mL | Freq: Two times a day (BID) | ORAL | Status: DC
  Administered 2020-01-02: 237 mL via ORAL

## 2020-01-01 NOTE — Progress Notes (Signed)
This is a patient of Dr. Ines Bloomer seen on rounds today.  She is postop day 3 from exploratory laparotomy with adhesiolysis for small bowel obstruction.  Yesterday, she was advanced to a full liquid diet.  She reports tolerating this well.  She denies any significant abdominal pain.  No nausea or vomiting.  She is passing flatus and having bowel movements.  Today's Vitals   01/01/20 0600 01/01/20 0759 01/01/20 1031 01/01/20 1156  BP:   125/74 121/73  Pulse:   77 77  Resp:    14  Temp:   99.3 F (37.4 C) 98.6 F (37 C)  TempSrc:   Oral Oral  SpO2:    100%  Weight:      Height:      PainSc: 0-No pain 0-No pain     Body mass index is 20.22 kg/m. Gen: No acute distress Abdomen: Soft, nontender, nondistended.  Incision without erythema, induration, or drainage.  Impression and plan: This is a 69 year old woman doing well after exploratory laparotomy for small bowel obstruction.  She is tolerating a full liquid diet and we will advance her to a soft diet.  Anticipate she will likely be ready for discharge tomorrow.

## 2020-01-01 NOTE — Care Management Important Message (Signed)
Important Message  Patient Details  Name: CHIMERE KLINGENSMITH MRN: [id number] Date of Birth: 06-28-1951   Medicare Important Message Given:  Yes     Dannette Barbara 01/01/2020, 11:16 AM

## 2020-01-02 LAB — MAGNESIUM: Magnesium: 1.7 mg/dL (ref 1.7–2.4)

## 2020-01-02 LAB — PHOSPHORUS: Phosphorus: 3.4 mg/dL (ref 2.5–4.6)

## 2020-01-02 MED ORDER — IBUPROFEN 800 MG PO TABS
800.0000 mg | ORAL_TABLET | Freq: Three times a day (TID) | ORAL | 0 refills | Status: DC | PRN
Start: 2020-01-02 — End: 2022-04-17

## 2020-01-02 NOTE — Discharge Instructions (Signed)
Surgery, Care After This sheet gives you information about how to care for yourself after your procedure. Your health care provider may also give you more specific instructions. If you have problems or questions, contact your health care provider. What can I expect after the procedure? After your procedure, it is common to have the following:  Pain in your abdomen, especially in the incision areas. You will be given medicine to control the pain.  Tiredness. This is a normal part of the recovery process. Your energy level will return to normal over the next several weeks.  Changes in your bowel movements, such as constipation or needing to go more often. Talk with your health care provider about how to manage this. Follow these instructions at home: Medicines   tylenol and advil as needed for discomfort.  Please alternate between the two every four hours as needed for pain.     Use narcotics, if prescribed, only when tylenol and motrin is not enough to control pain.   325-650mg  every 8hrs to max of 3000mg /24hrs (including the 325mg  in every norco dose) for the tylenol.     Advil up to 800mg  per dose every 8hrs as needed for pain.    Do not drive or use heavy machinery while taking prescription pain medicine.  Do not drink alcohol while taking prescription pain medicine.  Incision care     Follow instructions from your health care provider about how to take care of your incision areas. Make sure you: ? Keep your incisions clean and dry. ? Wash your hands with soap and water before and after applying medicine to the areas, and before and after changing your bandage (dressing). If soap and water are not available, use hand sanitizer. ? Change your dressing as told by your health care provider. ? Leave stitches (sutures), skin glue, or adhesive strips in place. These skin closures may need to stay in place for 2 weeks or longer. If adhesive strip edges start to loosen and curl up, you may  trim the loose edges. Do not remove adhesive strips completely unless your health care provider tells you to do that.  Do not wear tight clothing over the incisions. Tight clothing may rub and irritate the incision areas, which may cause the incisions to open.  Do not take baths, swim, or use a hot tub until your health care provider approves. OK TO SHOWER   Check your incision area every day for signs of infection. Check for: ? More redness, swelling, or pain. ? More fluid or blood. ? Warmth. ? Pus or a bad smell. Activity  Avoid lifting anything that is heavier than 10 lb (4.5 kg) for 2 weeks or until your health care provider says it is okay.  No pushing/pulling greater than 30lbs  You may resume normal activities as told by your health care provider. Ask your health care provider what activities are safe for you.  Take rest breaks during the day as needed. Eating and drinking  Follow instructions from your health care provider about what you can eat after surgery.  To prevent or treat constipation while you are taking prescription pain medicine, your health care provider may recommend that you: ? Drink enough fluid to keep your urine clear or pale yellow. ? Take over-the-counter or prescription medicines. ? Eat foods that are high in fiber, such as fresh fruits and vegetables, whole grains, and beans. ? Limit foods that are high in fat and processed sugars, such as fried and  sweet foods. General instructions  Ask your health care provider when you will need an appointment to get your sutures or staples removed.  Keep all follow-up visits as told by your health care provider. This is important. Contact a health care provider if:  You have more redness, swelling, or pain around your incisions.  You have more fluid or blood coming from the incisions.  Your incisions feel warm to the touch.  You have pus or a bad smell coming from your incisions or your dressing.  You have  a fever.  You have an incision that breaks open (edges not staying together) after sutures or staples have been removed. Get help right away if:  You develop a rash.  You have chest pain or difficulty breathing.  You have pain or swelling in your legs.  You feel light-headed or you faint.  Your abdomen swells (becomes distended).  You have nausea or vomiting.  You have blood in your stool (feces). This information is not intended to replace advice given to you by your health care provider. Make sure you discuss any questions you have with your health care provider. Document Released: 01/02/2005 Document Revised: 03/04/2018 Document Reviewed: 03/16/2016 Elsevier Interactive Patient Education  2019 [employer].

## 2020-01-02 NOTE — Op Note (Signed)
Preoperative diagnosis: SBO Postoperative diagnosis: Same  Procedure: Exploratory laparotomy, lysis of adhesions Anesthesia: GETA  Surgeon: Lysle Pearl  Wound Classification: Clean   Specimen: None Complications: None  EBL: 20 mL   Indications: See HPI Description of procedure: The patient was taken to the operating room and placed in the supine position. General endotracheal anesthesia was induced without any difficulty. A time-out was completed verifying correct patient, procedure, site, positioning, and implant(s) and/or special equipment prior to beginning this procedure. A Foley catheter and an nasogastric tube were placed. Preoperative antibiotics were given.  Infraumbilical incision was made after excising the old hysterectomy scar and abdominal cavity was entered through the fascia with no evidence of injury to the bowel.  Inspection of the entire length of small bowel noted two area of minimal adhesions.  The more proximal end adhesions near mid jejunum noted to have dilated bowel, with obviously decompressed bowel distal to it.  Adhesions easily removed with metz, inspection of the bowel noted some edema and bruising but all layers intact.  Inspection of the more distal adhesions did not show any evidence of additional obstruction, including a closed loop type, but the adhesions here were removed with Metzenbaums to prevent further issues.  Last inspection of the small bowel from the ileocecal valve to the ligament of Treitz noted no additional adhesions or any other pathology.  Fascia was then closed with 1 PDS x2, Exparel infused along the incision, 3-0 Vicryl used to approximate the subdermal layer, and then staples used to close the skin.  Incision then dressed with honeycomb dressing.  Patient then extubated transferred to the PACU in stable condition.  NG tube remained in place but Foley was removed prior to waking up from anesthesia.  At the end of the procedure all sponge and  instrument counts were correct.

## 2020-01-02 NOTE — Anesthesia Postprocedure Evaluation (Signed)
Anesthesia Post Note  Patient: Kim Potter  Procedure(s) Performed: EXPLORATORY LAPAROTOMY (N/A ) LYSIS OF ADHESION (N/A )  Patient location during evaluation: PACU Anesthesia Type: General Level of consciousness: awake and alert and oriented Pain management: pain level controlled Vital Signs Assessment: post-procedure vital signs reviewed and stable Respiratory status: spontaneous breathing Cardiovascular status: blood pressure returned to baseline Anesthetic complications: no   No complications documented.   Last Vitals:  Vitals:   01/01/20 1156 01/01/20 2225  BP: 121/73 124/67  Pulse: 77 73  Resp: 14 18  Temp: 37 C 36.9 C  SpO2: 100% 100%    Last Pain:  Vitals:   01/01/20 2225  TempSrc: Oral  PainSc:                  Trinidi Toppins

## 2020-01-02 NOTE — Progress Notes (Signed)
IV removed from patient. Discharge instructions given to patient. Verbalized understanding. No acute distress at this time. Patient to call ride for transportation.

## 2020-01-02 NOTE — Plan of Care (Signed)
The patient has been stable. No falls. Discharge education has been completed. IV removed. The teach back method has been utilized. Problem: Education: Goal: Knowledge of General Education information will improve Description: Including pain rating scale, medication(s)/side effects and non-pharmacologic comfort measures Outcome: Completed/Met   Problem: Clinical Measurements: Goal: Ability to maintain clinical measurements within normal limits will improve Outcome: Completed/Met Goal: Will remain free from infection Outcome: Completed/Met Goal: Diagnostic test results will improve Outcome: Completed/Met Goal: Respiratory complications will improve Outcome: Completed/Met Goal: Cardiovascular complication will be avoided Outcome: Completed/Met   Problem: Activity: Goal: Risk for activity intolerance will decrease Outcome: Completed/Met   Problem: Nutrition: Goal: Adequate nutrition will be maintained Outcome: Completed/Met   Problem: Coping: Goal: Level of anxiety will decrease Outcome: Completed/Met   Problem: Elimination: Goal: Will not experience complications related to bowel motility Outcome: Completed/Met Goal: Will not experience complications related to urinary retention Outcome: Completed/Met   Problem: Pain Managment: Goal: General experience of comfort will improve Outcome: Completed/Met   Problem: Safety: Goal: Ability to remain free from injury will improve Outcome: Completed/Met   Problem: Skin Integrity: Goal: Risk for impaired skin integrity will decrease Outcome: Completed/Met   Problem: Malnutrition  (NI-5.2) Goal: Food and/or nutrient delivery Description: Individualized approach for food/nutrient provision. Outcome: Completed/Met

## 2020-01-02 NOTE — Discharge Summary (Addendum)
Physician Discharge Summary  Patient ID: Kim Potter MRN: [id number] DOB/AGE: [birth year] 69 y.o.  Admit date: 12/27/2019 Discharge date: 01/02/20  Admission Diagnoses: SBO  Discharge Diagnoses:  Same as above  Discharged Condition: good  Hospital Course: Admitted for above.  Small bowel follow-through showed persistent small bowel obstruction and no clinical improvement, therefore patient taken to the operating room for exploratory laparotomy and lysis of adhesions.  Please see op note for details.  Postop recovered as expected with gradual advancement of diet.  At time of discharge, patient was tolerating soft diet, passing flatus with multiple bowel movements, and in no pain.  Patient will follow up in my clinic for staple removal next week.  Discharge instructions discussed.  Consults: None  Discharge Exam: Blood pressure 132/85, pulse 76, temperature 98.2 F (36.8 C), temperature source Oral, resp. rate 16, height 5\' 1"  (1.549 m), weight 48.5 kg, last menstrual period 01/08/1979, SpO2 100 %. General appearance: alert, cooperative and no distress GI: soft, non-tender; bowel sounds normal; no masses,  no organomegaly staples clean dry and intact  Disposition:  Discharge disposition: 01-Home or Self Care       Discharge Instructions    Discharge patient   Complete by: As directed    Discharge disposition: 01-Home or Self Care   Discharge patient date: 01/02/2020     Allergies as of 01/02/2020      Reactions   Penicillins Itching, Rash   Has patient had a PCN reaction causing immediate rash, facial/tongue/throat swelling, SOB or lightheadedness with hypotension: No Has patient had a PCN reaction causing severe rash involving mucus membranes or skin necrosis: No Has patient had a PCN reaction that required hospitalization: No Has patient had a PCN reaction occurring within the last 10 years: No If all of the above answers are "NO", then may proceed with Cephalosporin use.       Medication List    TAKE these medications   acetaminophen 325 MG tablet Commonly known as: TYLENOL Take by mouth.   amLODipine 10 MG tablet Commonly known as: NORVASC Take 0.5 tablets (5 mg total) by mouth daily.   famotidine 40 MG tablet Commonly known as: PEPCID Take 40 mg by mouth every evening.   fexofenadine 180 MG tablet Commonly known as: ALLEGRA Take by mouth.   fluticasone 50 MCG/ACT nasal spray Commonly known as: FLONASE Place 1 spray into both nostrils daily.   ibuprofen 800 MG tablet Commonly known as: ADVIL Take 1 tablet (800 mg total) by mouth every 8 (eight) hours as needed for mild pain or moderate pain.   metoprolol succinate 50 MG 24 hr tablet Commonly known as: TOPROL-XL Take 50 mg by mouth daily.   simvastatin 20 MG tablet Commonly known as: ZOCOR Take 20 mg by mouth at bedtime.       Follow-up Information    Denim Start, DO Follow up in 1 week(s).   Specialty: Surgery Why: post op, staple removal Contact information: [address] [place] [address] [phone]                Total time spent arranging discharge was >18min. Signed: Benjamine Sprague 01/02/2020, 9:54 AM

## 2020-01-09 NOTE — Progress Notes (Signed)
 Subjective:   CC: SBO (small bowel obstruction) (CMS-HCC) [K56.609] POSTOP  HPI: Kim Potter is a 69 y.o. female who is here for followup from above.  No issues.     Current Medications: has a current medication list which includes the following prescription(s): acetaminophen , amlodipine , famotidine , fluticasone  propionate, metoprolol  succinate, omeprazole, simvastatin , sucralfate , aspirin, citalopram, prednisone, and triamterene -hydrochlorothiazide .  Allergies:  Allergies  Allergen Reactions  . Penicillins Itching and Rash    Has patient had a PCN reaction causing immediate rash, facial/tongue/throat swelling, SOB or lightheadedness with hypotension: No Has patient had a PCN reaction causing severe rash involving mucus membranes or skin necrosis: No Has patient had a PCN reaction that required hospitalization: No Has patient had a PCN reaction occurring within the last 10 years: No If all of the above answers are NO, then may proceed with Cephalosporin use.    ROS: General: Denies weight loss, weight gain, fatigue, fevers, chills, and night sweats. Heart: Denies chest pain, palpitations, racing heart, irregular heartbeat, leg pain or swelling, and decreased activity tolerance. Respiratory: Denies breathing difficulty, shortness of breath, wheezing, cough, and sputum. GI: Denies change in appetite, heartburn, nausea, vomiting, constipation, diarrhea, and blood in stool. GU: Denies difficulty urinating, pain with urinating, urgency, frequency, blood in urine    Objective:    BP 104/81   Pulse 84   Ht 154.9 cm (5' 1)   Wt 46.3 kg (102 lb)   BMI 19.27 kg/m   Constitutional :  alert, appears stated age, cooperative and no distress  Gastrointestinal: soft, non-tender; bowel sounds normal; no masses,  no organomegaly.    Musculoskeletal: Steady gait and movement  Skin: Cool and moist, midline incision clean, dry, intact.  No erythema, induration or drainage to indicate  infection.    Psychiatric: Normal affect, non-agitated, not confused       LABS:  N/A   RADS: N/A  Assessment:     SBO (small bowel obstruction) (CMS-HCC) [K56.609]  S/p ex-lap. LOA  Plan:    1. Healing well. Staples removed with no issues.  Healing ridge will flatten and resolve on its own.  Continue activity restrictions as previously discussed. All questions addressed. F/u prn.

## 2020-01-11 NOTE — Telephone Encounter (Signed)
 This the first time seeing anything about this

## 2020-02-15 ENCOUNTER — Encounter: Payer: Self-pay | Admitting: Emergency Medicine

## 2020-02-15 ENCOUNTER — Emergency Department: Payer: Medicare HMO

## 2020-02-15 ENCOUNTER — Other Ambulatory Visit: Payer: Self-pay

## 2020-02-15 DIAGNOSIS — Z79899 Other long term (current) drug therapy: Secondary | ICD-10-CM | POA: Diagnosis not present

## 2020-02-15 DIAGNOSIS — R0789 Other chest pain: Secondary | ICD-10-CM | POA: Diagnosis not present

## 2020-02-15 DIAGNOSIS — I10 Essential (primary) hypertension: Secondary | ICD-10-CM | POA: Insufficient documentation

## 2020-02-15 LAB — TROPONIN I (HIGH SENSITIVITY)
Troponin I (High Sensitivity): <2 ng/L (ref <18)
Troponin I (High Sensitivity): <2 ng/L (ref <18)

## 2020-02-15 LAB — BASIC METABOLIC PANEL
Anion gap: 11 (ref 5–15)
BUN: 15 mg/dL (ref 8–23)
CO2: 28 mmol/L (ref 22–32)
Calcium: 10 mg/dL (ref 8.9–10.3)
Chloride: 101 mmol/L (ref 98–111)
Creatinine, Ser: 0.71 mg/dL (ref 0.44–1.00)
GFR calc Af Amer: >60 mL/min (ref >60)
GFR calc non Af Amer: >60 mL/min (ref >60)
Glucose, Bld: 94 mg/dL (ref 70–99)
Potassium: 3.5 mmol/L (ref 3.5–5.1)
Sodium: 140 mmol/L (ref 135–145)

## 2020-02-15 LAB — CBC
HCT: 35.8 % — ABNORMAL LOW (ref 36.0–46.0)
Hemoglobin: 11.7 g/dL — ABNORMAL LOW (ref 12.0–15.0)
MCH: 23 pg — ABNORMAL LOW (ref 26.0–34.0)
MCHC: 32.7 g/dL (ref 30.0–36.0)
MCV: 70.5 fL — ABNORMAL LOW (ref 80.0–100.0)
Platelets: 189 10*3/uL (ref 150–400)
RBC: 5.08 MIL/uL (ref 3.87–5.11)
RDW: 16 % — ABNORMAL HIGH (ref 11.5–15.5)
WBC: 5.3 10*3/uL (ref 4.0–10.5)
nRBC: 0 % (ref 0.0–0.2)

## 2020-02-15 NOTE — ED Triage Notes (Signed)
Pt presents to ED via wheelchair from [facility] with c/o generalized CP that started earlier today. Pt states hx of HT. Pt states pain 4/5, pt states pain pressure in nature. Pt presents to ED A&O X 4.

## 2020-02-15 NOTE — ED Notes (Signed)
Lab called to draw bloodwork.

## 2020-02-16 ENCOUNTER — Emergency Department
Admission: EM | Admit: 2020-02-16 | Discharge: 2020-02-16 | Disposition: A | Discharge status: Home / Self Care | Payer: Medicare HMO | Attending: Emergency Medicine | Admitting: Emergency Medicine

## 2020-02-16 DIAGNOSIS — R079 Chest pain, unspecified: Secondary | ICD-10-CM

## 2020-02-16 NOTE — ED Provider Notes (Signed)
[facility] Emergency Department Provider Note  ____________________________________________  Time seen: Approximately 3:17 AM  I have reviewed the triage vital signs and the nursing notes.   HISTORY  Chief Complaint Chest Pain   HPI Kim Potter is a 69 y.o. female with a history of hypertension, hyperlipidemia, laryngeal cancer on remission, SBO who presents for evaluation of chest pain.  Patient reports that she started having chest pain around 1:30 PM this afternoon.  She reports that the pain felt like indigestion, pressure in the center of her chest.  Lasted several hours but at this point resolved.  Patient reports that her PCP has been adjusting her metoprolol since patient was found to be extremely hypotensive on it.  She has been checking her blood pressure several times a day for the last 3 weeks since the changes in the medication have been happening.  She reports that her blood pressure has been low but today was elevated when she was having the chest pain.  At this time the pain has resolved.  She said the pain was associated with mild nausea and shortness of breath.  No personal or family history of PE or DVT, no recent travel immobilization, no leg pain or swelling, no hemoptysis or exogenous hormones.  Patient denies prior history of coronary artery disease.   Past Medical History:  Diagnosis Date  . Hyperlipidemia   . Hypertension     Patient Active Problem List   Diagnosis Date Noted  . Malnutrition of moderate degree 12/29/2019  . SBO (small bowel obstruction) ([place]) 12/27/2019    Past Surgical History:  Procedure Laterality Date  . LAPAROTOMY N/A 12/29/2019   Procedure: EXPLORATORY LAPAROTOMY;  Surgeon: Benjamine Sprague, DO;  Location: [facility] ORS;  Service: General;  Laterality: N/A;  . LYSIS OF ADHESION N/A 12/29/2019   Procedure: LYSIS OF ADHESION;  Surgeon: Benjamine Sprague, DO;  Location: [facility] ORS;  Service: General;  Laterality: N/A;  . PARTIAL  HYSTERECTOMY      Prior to Admission medications   Medication Sig Start Date End Date Taking? Authorizing Provider  acetaminophen (TYLENOL) 325 MG tablet Take by mouth.  04/18/19   [provider]  amLODipine (NORVASC) 10 MG tablet Take 0.5 tablets (5 mg total) by mouth daily. 06/15/19   [provider]  famotidine (PEPCID) 40 MG tablet Take 40 mg by mouth every evening.  11/30/19 12/30/19  [provider]  fexofenadine (ALLEGRA) 180 MG tablet Take by mouth.    [provider]  fluticasone (FLONASE) 50 MCG/ACT nasal spray Place 1 spray into both nostrils daily.    [provider]  ibuprofen (ADVIL) 800 MG tablet Take 1 tablet (800 mg total) by mouth every 8 (eight) hours as needed for mild pain or moderate pain. 01/02/20   Lysle Pearl, Is[place], DO  metoprolol succinate (TOPROL-XL) 50 MG 24 hr tablet Take 50 mg by mouth daily.  06/15/19 06/14/20  [provider]  simvastatin (ZOCOR) 20 MG tablet Take 20 mg by mouth at bedtime.     [provider]    Allergies Penicillins  Family History  Problem Relation Age of Onset  . Diabetes Brother   . Kidney disease Brother   . Breast cancer Neg Hx     Social History Social History   Tobacco Use  . Smoking status: Never Smoker  . Smokeless tobacco: Never Used  Vaping Use  . Vaping Use: Never used  Substance Use Topics  . Alcohol use: No  .  Drug use: No    Review of Systems  Constitutional: Negative for fever. Eyes: Negative for visual changes. ENT: Negative for sore throat. Neck: No neck pain  Cardiovascular: + chest pain. Respiratory: Negative for shortness of breath. Gastrointestinal: Negative for abdominal pain, vomiting or diarrhea. Genitourinary: Negative for dysuria. Musculoskeletal: Negative for back pain. Skin: Negative for rash. Neurological: Negative for headaches, weakness or numbness. Psych: No SI or HI  ____________________________________________   PHYSICAL  EXAM:  VITAL SIGNS: ED Triage Vitals  Enc Vitals Group     BP 02/15/20 1715 128/85     Pulse Rate 02/15/20 1715 85     Resp 02/15/20 1715 18     Temp 02/15/20 1715 98.8 F (37.1 C)     Temp Source 02/15/20 1715 Oral     SpO2 02/15/20 1715 100 %     Weight 02/15/20 1712 106 lb 14.8 oz (48.5 kg)     Height 02/15/20 1712 5\' 1"  (1.549 m)     Head Circumference --      Peak Flow --      Pain Score 02/15/20 1712 4     Pain Loc --      Pain Edu? --      Excl. in [place]? --     Constitutional: Alert and oriented. Well appearing and in no apparent distress. HEENT:      Head: Normocephalic and atraumatic.         Eyes: Conjunctivae are normal. Sclera is non-icteric.       Mouth/Throat: Mucous membranes are moist.       Neck: Supple with no signs of meningismus. Cardiovascular: Regular rate and rhythm. No murmurs, gallops, or rubs. 2+ symmetrical distal pulses are present in all extremities. No JVD. Respiratory: Normal respiratory effort. Lungs are clear to auscultation bilaterally.  Gastrointestinal: Soft, non tender. Musculoskeletal:  No edema, cyanosis, or erythema of extremities. Neurologic: Normal speech and language. Face is symmetric. Moving all extremities. No gross focal neurologic deficits are appreciated. Skin: Skin is warm, dry and intact. No rash noted. Psychiatric: Mood and affect are normal. Speech and behavior are normal.  ____________________________________________   LABS (all labs ordered are listed, but only abnormal results are displayed)  Labs Reviewed  CBC - Abnormal; Notable for the following components:      Result Value   Hemoglobin 11.7 (*)    HCT 35.8 (*)    MCV 70.5 (*)    MCH 23.0 (*)    RDW 16.0 (*)    All other components within normal limits  BASIC METABOLIC PANEL  TROPONIN I (HIGH SENSITIVITY)  TROPONIN I (HIGH SENSITIVITY)   ____________________________________________  EKG  ED ECG REPORT I, Rudene Re, the attending physician,  personally viewed and interpreted this ECG.  Normal sinus rhythm, rate of 80, normal intervals, normal axis, no ST elevations or depressions.  Normal EKG. ____________________________________________  RADIOLOGY  I have personally reviewed the images performed during this visit and I agree with the Radiologist's read.   Interpretation by Radiologist:  DG Chest 2 View  Result Date: 02/15/2020 CLINICAL DATA:  Chest pain. EXAM: CHEST - 2 VIEW COMPARISON:  Prior chest radiographs 03/30/2017 and earlier FINDINGS: Heart size within normal limits. No appreciable airspace consolidation or pulmonary edema. No evidence of pleural effusion or pneumothorax. No acute bony abnormality identified. Redemonstrated mild chronic midthoracic vertebral anterior wedge compression deformity. IMPRESSION: No evidence of acute cardiopulmonary abnormality. Electronically Signed   By: Kellie Simmering DO   On: 02/15/2020 17:55  ____________________________________________   PROCEDURES  Procedure(s) performed: None Procedures Critical Care performed:  None ____________________________________________   INITIAL IMPRESSION / ASSESSMENT AND PLAN / ED COURSE   69 y.o. female with a history of hypertension, hyperlipidemia, laryngeal cancer on remission, SBO who presents for evaluation of chest pain earlier today.  Patient is currently asymptomatic.  She is well-appearing in no distress with normal vital signs, strong equal pulses in all extremities, no asymmetric leg swelling or pitting edema, lungs are clear to auscultation.  EKG with no signs of ischemia or dysrhythmias.  Chest x-ray visualized with no acute findings, confirmed by radiology.  Differential diagnoses include indigestion versus GERD versus ACS.  Less likely PE or dissection with fully resolved symptoms, no tachypnea, tachycardia, hypoxia, no significantly elevated BP, normal pulses in all 4 extremities, no neuro deficits, no pain radiating to the back,  and no widened mediastinum on chest x-ray.  High-sensitivity troponin x2 was done for further stratification which are negative.  Remaining of her labs in no acute findings.  Old medical records reviewed.  With normal EKG, normal work-up, normal troponin x2, and pain has fully resolved several hours ago I feel the patient is safe for discharge home with follow-up with PCP.  Discussed my standard return precautions.      _____________________________________________ Please note:  Patient was evaluated in Emergency Department today for the symptoms described in the history of present illness. Patient was evaluated in the context of the global COVID-19 pandemic, which necessitated consideration that the patient might be at risk for infection with the SARS-CoV-2 virus that causes COVID-19. Institutional protocols and algorithms that pertain to the evaluation of patients at risk for COVID-19 are in a state of rapid change based on information released by regulatory bodies including the CDC and federal and state organizations. These policies and algorithms were followed during the patient's care in the ED.  Some ED evaluations and interventions may be delayed as a result of limited staffing during the pandemic.   [place] Controlled Substance Database was reviewed by me. ____________________________________________   FINAL CLINICAL IMPRESSION(S) / ED DIAGNOSES   Final diagnoses:  Chest pain, unspecified type      NEW MEDICATIONS STARTED DURING THIS VISIT:  ED Discharge Orders    None       Note:  This document was prepared using Dragon voice recognition software and may include unintentional dictation errors.    Alfred Levins, [place], MD 02/16/20 [phone]

## 2020-02-16 NOTE — ED Notes (Signed)
Pt states coming in due to fluctuating blood pressures. Pt is on cardiac, bp and pulse ox monitor.

## 2020-02-16 NOTE — Discharge Instructions (Signed)

## 2020-03-02 DIAGNOSIS — C329 Malignant neoplasm of larynx, unspecified: Secondary | ICD-10-CM | POA: Diagnosis not present

## 2020-04-01 DIAGNOSIS — C329 Malignant neoplasm of larynx, unspecified: Secondary | ICD-10-CM | POA: Diagnosis not present

## 2020-04-04 DIAGNOSIS — L309 Dermatitis, unspecified: Secondary | ICD-10-CM | POA: Diagnosis not present

## 2020-04-04 DIAGNOSIS — D500 Iron deficiency anemia secondary to blood loss (chronic): Secondary | ICD-10-CM | POA: Diagnosis not present

## 2020-04-04 DIAGNOSIS — K219 Gastro-esophageal reflux disease without esophagitis: Secondary | ICD-10-CM | POA: Diagnosis not present

## 2020-04-04 DIAGNOSIS — E7800 Pure hypercholesterolemia, unspecified: Secondary | ICD-10-CM | POA: Diagnosis not present

## 2020-04-04 DIAGNOSIS — Z87898 Personal history of other specified conditions: Secondary | ICD-10-CM | POA: Diagnosis not present

## 2020-04-04 DIAGNOSIS — Z8521 Personal history of malignant neoplasm of larynx: Secondary | ICD-10-CM | POA: Diagnosis not present

## 2020-04-16 DIAGNOSIS — J387 Other diseases of larynx: Secondary | ICD-10-CM | POA: Diagnosis not present

## 2020-04-16 DIAGNOSIS — C329 Malignant neoplasm of larynx, unspecified: Secondary | ICD-10-CM | POA: Diagnosis not present

## 2020-05-02 DIAGNOSIS — C329 Malignant neoplasm of larynx, unspecified: Secondary | ICD-10-CM | POA: Diagnosis not present

## 2020-05-09 DIAGNOSIS — M542 Cervicalgia: Secondary | ICD-10-CM | POA: Diagnosis not present

## 2020-05-09 DIAGNOSIS — K219 Gastro-esophageal reflux disease without esophagitis: Secondary | ICD-10-CM | POA: Diagnosis not present

## 2020-08-19 DIAGNOSIS — C329 Malignant neoplasm of larynx, unspecified: Secondary | ICD-10-CM | POA: Diagnosis not present

## 2020-08-19 DIAGNOSIS — R911 Solitary pulmonary nodule: Secondary | ICD-10-CM | POA: Diagnosis not present

## 2020-08-19 DIAGNOSIS — C320 Malignant neoplasm of glottis: Secondary | ICD-10-CM | POA: Diagnosis not present

## 2020-08-19 DIAGNOSIS — J387 Other diseases of larynx: Secondary | ICD-10-CM | POA: Diagnosis not present

## 2020-08-19 DIAGNOSIS — R918 Other nonspecific abnormal finding of lung field: Secondary | ICD-10-CM | POA: Diagnosis not present

## 2020-08-19 DIAGNOSIS — R946 Abnormal results of thyroid function studies: Secondary | ICD-10-CM | POA: Diagnosis not present

## 2020-08-19 DIAGNOSIS — Z923 Personal history of irradiation: Secondary | ICD-10-CM | POA: Diagnosis not present

## 2020-08-22 DIAGNOSIS — R69 Illness, unspecified: Secondary | ICD-10-CM | POA: Diagnosis not present

## 2020-09-12 DIAGNOSIS — H2513 Age-related nuclear cataract, bilateral: Secondary | ICD-10-CM | POA: Diagnosis not present

## 2020-09-12 DIAGNOSIS — H1045 Other chronic allergic conjunctivitis: Secondary | ICD-10-CM | POA: Diagnosis not present

## 2020-09-12 DIAGNOSIS — H04123 Dry eye syndrome of bilateral lacrimal glands: Secondary | ICD-10-CM | POA: Diagnosis not present

## 2020-09-12 DIAGNOSIS — H43393 Other vitreous opacities, bilateral: Secondary | ICD-10-CM | POA: Diagnosis not present

## 2020-09-12 DIAGNOSIS — Z0100 Encounter for examination of eyes and vision without abnormal findings: Secondary | ICD-10-CM | POA: Diagnosis not present

## 2020-10-23 DIAGNOSIS — Z87898 Personal history of other specified conditions: Secondary | ICD-10-CM | POA: Diagnosis not present

## 2020-10-23 DIAGNOSIS — D500 Iron deficiency anemia secondary to blood loss (chronic): Secondary | ICD-10-CM | POA: Diagnosis not present

## 2020-10-23 DIAGNOSIS — Z8521 Personal history of malignant neoplasm of larynx: Secondary | ICD-10-CM | POA: Diagnosis not present

## 2020-10-23 DIAGNOSIS — E7800 Pure hypercholesterolemia, unspecified: Secondary | ICD-10-CM | POA: Diagnosis not present

## 2020-10-23 DIAGNOSIS — K219 Gastro-esophageal reflux disease without esophagitis: Secondary | ICD-10-CM | POA: Diagnosis not present

## 2020-10-24 DIAGNOSIS — Z0000 Encounter for general adult medical examination without abnormal findings: Secondary | ICD-10-CM | POA: Diagnosis not present

## 2020-10-24 DIAGNOSIS — D72819 Decreased white blood cell count, unspecified: Secondary | ICD-10-CM | POA: Diagnosis not present

## 2020-11-19 DIAGNOSIS — E041 Nontoxic single thyroid nodule: Secondary | ICD-10-CM | POA: Diagnosis not present

## 2020-11-19 DIAGNOSIS — C329 Malignant neoplasm of larynx, unspecified: Secondary | ICD-10-CM | POA: Diagnosis not present

## 2020-12-13 DIAGNOSIS — E041 Nontoxic single thyroid nodule: Secondary | ICD-10-CM | POA: Diagnosis not present

## 2020-12-17 DIAGNOSIS — Z1231 Encounter for screening mammogram for malignant neoplasm of breast: Secondary | ICD-10-CM | POA: Diagnosis not present

## 2021-02-04 DIAGNOSIS — E041 Nontoxic single thyroid nodule: Secondary | ICD-10-CM | POA: Diagnosis not present

## 2021-02-04 IMAGING — DX DG ABD PORTABLE 1V
1 series · 1 of 1 positions shown · non-contrast
Comparison: None.

CLINICAL DATA: Nasogastric tube placement.

EXAM:
PORTABLE ABDOMEN - 1 VIEW

[abdomen supine]
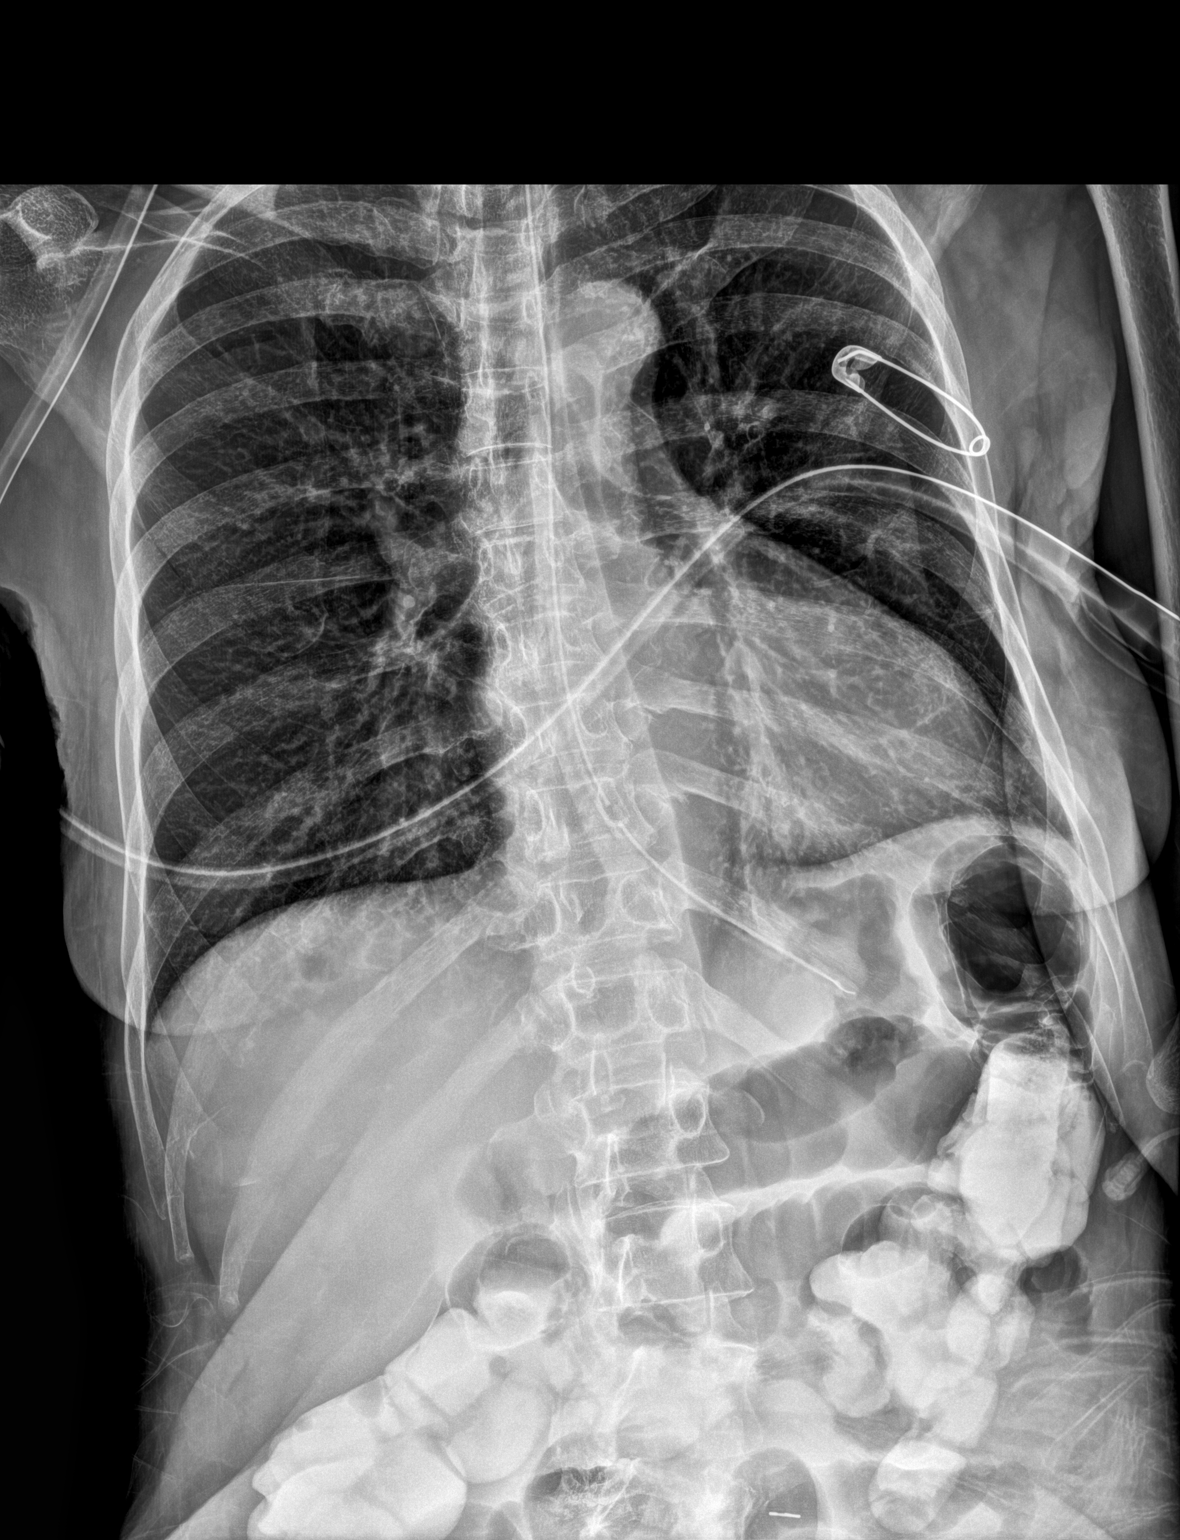

[1 of 1 positions shown; findings below may reference images not displayed]

FINDINGS: A nasogastric tube is seen with its distal tip overlying the body of
the stomach. This is approximately 7.5 cm distal to the
gastroesophageal junction. Radiopaque contrast is again noted within
the large bowel. Mildly prominent small bowel loops are suspected
within the left upper quadrant. No radio-opaque calculi or other
significant radiographic abnormality are seen.
IMPRESSION: 1. Nasogastric tube positioning, as described above. Further
advancement of the NG tube by approximately 6 cm is recommended to
decrease the risk of aspiration.
2. Mildly prominent small bowel loops within the left upper
quadrant, which may represent a focal ileus.

## 2021-02-04 IMAGING — DX DG ABD PORTABLE 2V
2 series · 2 of 2 positions shown · non-contrast
Comparison: 12/28/2019

CLINICAL DATA: Small bowel obstruction 1 day postop exploratory
laparotomy with lysis of adhesions.

EXAM:
PORTABLE ABDOMEN - 2 VIEW

[abdomen erect]
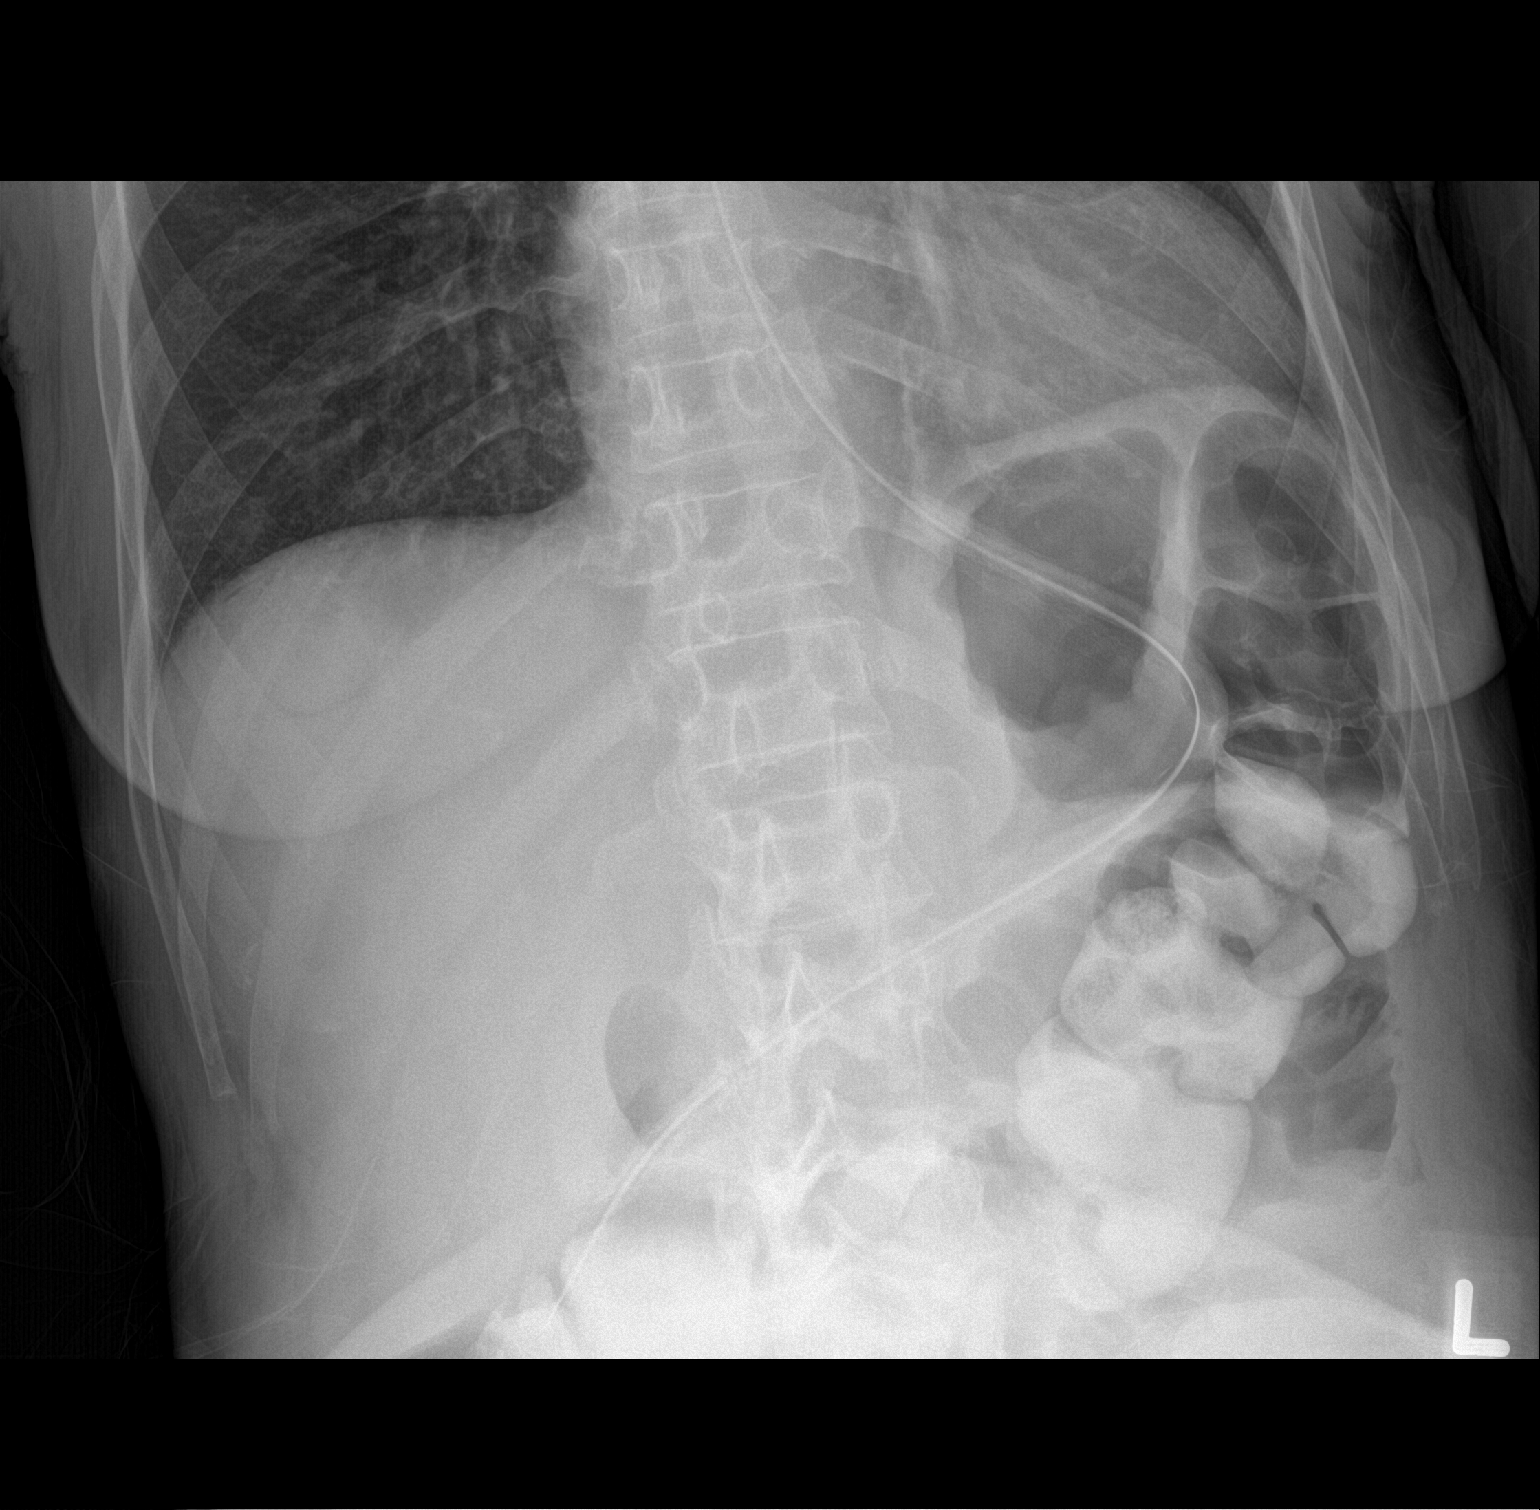

[abdomen supine]
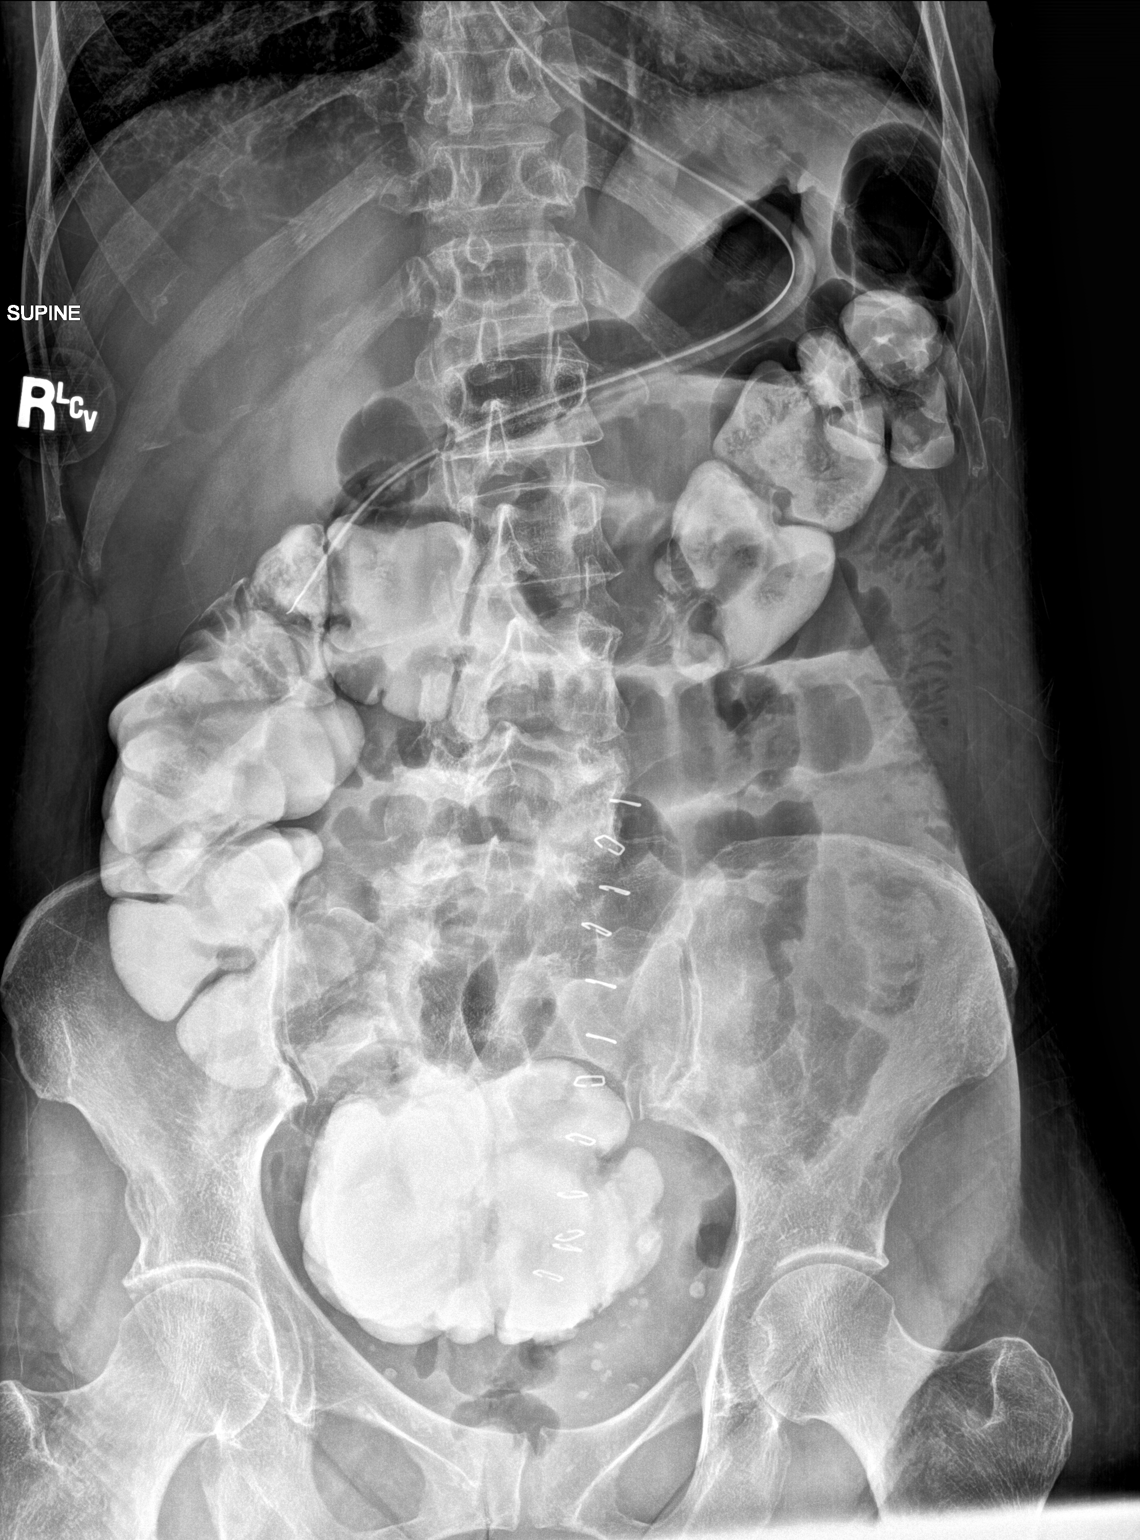

[2 of 2 positions shown; findings below may reference images not displayed]

FINDINGS: Nasogastric tube is present with tip over the right mid abdomen
likely over the distal stomach or duodenal C sweep. Skin staples are
present vertically over the lower abdomen/pelvis just left of
midline. There is contrast present throughout the colon from
patient's recent CT scan. No significant dilated small bowel loops
and no evidence of free peritoneal air. Remainder the exam is
unchanged.
IMPRESSION: Nonspecific, nonobstructive bowel gas pattern with contrast
throughout the colon. Postsurgical change as described. NG tube with
tip over the right abdomen.

## 2021-02-12 DIAGNOSIS — C320 Malignant neoplasm of glottis: Secondary | ICD-10-CM | POA: Diagnosis not present

## 2021-02-12 DIAGNOSIS — Z923 Personal history of irradiation: Secondary | ICD-10-CM | POA: Diagnosis not present

## 2021-02-12 DIAGNOSIS — R918 Other nonspecific abnormal finding of lung field: Secondary | ICD-10-CM | POA: Diagnosis not present

## 2021-02-12 DIAGNOSIS — C329 Malignant neoplasm of larynx, unspecified: Secondary | ICD-10-CM | POA: Diagnosis not present

## 2021-02-12 DIAGNOSIS — J387 Other diseases of larynx: Secondary | ICD-10-CM | POA: Diagnosis not present

## 2021-02-12 DIAGNOSIS — R946 Abnormal results of thyroid function studies: Secondary | ICD-10-CM | POA: Diagnosis not present

## 2021-04-18 DIAGNOSIS — D72819 Decreased white blood cell count, unspecified: Secondary | ICD-10-CM | POA: Diagnosis not present

## 2021-04-18 DIAGNOSIS — E7800 Pure hypercholesterolemia, unspecified: Secondary | ICD-10-CM | POA: Diagnosis not present

## 2021-04-25 DIAGNOSIS — E7800 Pure hypercholesterolemia, unspecified: Secondary | ICD-10-CM | POA: Diagnosis not present

## 2021-04-25 DIAGNOSIS — I10 Essential (primary) hypertension: Secondary | ICD-10-CM | POA: Diagnosis not present

## 2021-04-25 DIAGNOSIS — D500 Iron deficiency anemia secondary to blood loss (chronic): Secondary | ICD-10-CM | POA: Diagnosis not present

## 2021-05-20 DIAGNOSIS — I10 Essential (primary) hypertension: Secondary | ICD-10-CM | POA: Diagnosis not present

## 2021-05-20 DIAGNOSIS — E041 Nontoxic single thyroid nodule: Secondary | ICD-10-CM | POA: Diagnosis not present

## 2021-05-20 DIAGNOSIS — C329 Malignant neoplasm of larynx, unspecified: Secondary | ICD-10-CM | POA: Diagnosis not present

## 2021-05-20 DIAGNOSIS — Z79899 Other long term (current) drug therapy: Secondary | ICD-10-CM | POA: Diagnosis not present

## 2021-05-20 DIAGNOSIS — Z880 Allergy status to penicillin: Secondary | ICD-10-CM | POA: Diagnosis not present

## 2021-07-18 DIAGNOSIS — D500 Iron deficiency anemia secondary to blood loss (chronic): Secondary | ICD-10-CM | POA: Diagnosis not present

## 2021-07-18 DIAGNOSIS — E7800 Pure hypercholesterolemia, unspecified: Secondary | ICD-10-CM | POA: Diagnosis not present

## 2021-07-25 DIAGNOSIS — E7800 Pure hypercholesterolemia, unspecified: Secondary | ICD-10-CM | POA: Diagnosis not present

## 2021-07-25 DIAGNOSIS — J309 Allergic rhinitis, unspecified: Secondary | ICD-10-CM | POA: Diagnosis not present

## 2021-07-25 DIAGNOSIS — Z87898 Personal history of other specified conditions: Secondary | ICD-10-CM | POA: Diagnosis not present

## 2021-07-25 DIAGNOSIS — D500 Iron deficiency anemia secondary to blood loss (chronic): Secondary | ICD-10-CM | POA: Diagnosis not present

## 2021-07-25 DIAGNOSIS — I10 Essential (primary) hypertension: Secondary | ICD-10-CM | POA: Diagnosis not present

## 2021-08-15 DIAGNOSIS — Z8521 Personal history of malignant neoplasm of larynx: Secondary | ICD-10-CM | POA: Diagnosis not present

## 2021-08-15 DIAGNOSIS — E039 Hypothyroidism, unspecified: Secondary | ICD-10-CM | POA: Diagnosis not present

## 2021-08-15 DIAGNOSIS — K117 Disturbances of salivary secretion: Secondary | ICD-10-CM | POA: Diagnosis not present

## 2021-08-15 DIAGNOSIS — C329 Malignant neoplasm of larynx, unspecified: Secondary | ICD-10-CM | POA: Diagnosis not present

## 2021-08-15 DIAGNOSIS — J387 Other diseases of larynx: Secondary | ICD-10-CM | POA: Diagnosis not present

## 2021-08-15 DIAGNOSIS — Z08 Encounter for follow-up examination after completed treatment for malignant neoplasm: Secondary | ICD-10-CM | POA: Diagnosis not present

## 2021-08-15 DIAGNOSIS — Z923 Personal history of irradiation: Secondary | ICD-10-CM | POA: Diagnosis not present

## 2021-08-15 DIAGNOSIS — R1310 Dysphagia, unspecified: Secondary | ICD-10-CM | POA: Diagnosis not present

## 2021-08-15 DIAGNOSIS — R918 Other nonspecific abnormal finding of lung field: Secondary | ICD-10-CM | POA: Diagnosis not present

## 2021-09-19 DIAGNOSIS — H2513 Age-related nuclear cataract, bilateral: Secondary | ICD-10-CM | POA: Diagnosis not present

## 2021-09-19 DIAGNOSIS — H04123 Dry eye syndrome of bilateral lacrimal glands: Secondary | ICD-10-CM | POA: Diagnosis not present

## 2021-09-19 DIAGNOSIS — H26063 Combined forms of infantile and juvenile cataract, bilateral: Secondary | ICD-10-CM | POA: Diagnosis not present

## 2021-09-19 DIAGNOSIS — H43393 Other vitreous opacities, bilateral: Secondary | ICD-10-CM | POA: Diagnosis not present

## 2021-09-19 DIAGNOSIS — H1045 Other chronic allergic conjunctivitis: Secondary | ICD-10-CM | POA: Diagnosis not present

## 2021-11-06 DIAGNOSIS — L821 Other seborrheic keratosis: Secondary | ICD-10-CM | POA: Diagnosis not present

## 2021-11-06 DIAGNOSIS — L918 Other hypertrophic disorders of the skin: Secondary | ICD-10-CM | POA: Diagnosis not present

## 2021-11-06 DIAGNOSIS — L810 Postinflammatory hyperpigmentation: Secondary | ICD-10-CM | POA: Diagnosis not present

## 2021-11-06 DIAGNOSIS — D235 Other benign neoplasm of skin of trunk: Secondary | ICD-10-CM | POA: Diagnosis not present

## 2021-11-06 DIAGNOSIS — L814 Other melanin hyperpigmentation: Secondary | ICD-10-CM | POA: Diagnosis not present

## 2021-11-19 DIAGNOSIS — E7800 Pure hypercholesterolemia, unspecified: Secondary | ICD-10-CM | POA: Diagnosis not present

## 2021-11-19 DIAGNOSIS — D500 Iron deficiency anemia secondary to blood loss (chronic): Secondary | ICD-10-CM | POA: Diagnosis not present

## 2021-11-26 DIAGNOSIS — Z1389 Encounter for screening for other disorder: Secondary | ICD-10-CM | POA: Diagnosis not present

## 2021-11-26 DIAGNOSIS — E785 Hyperlipidemia, unspecified: Secondary | ICD-10-CM | POA: Diagnosis not present

## 2021-11-26 DIAGNOSIS — I10 Essential (primary) hypertension: Secondary | ICD-10-CM | POA: Diagnosis not present

## 2021-11-26 DIAGNOSIS — Z0000 Encounter for general adult medical examination without abnormal findings: Secondary | ICD-10-CM | POA: Diagnosis not present

## 2021-12-23 DIAGNOSIS — Z1231 Encounter for screening mammogram for malignant neoplasm of breast: Secondary | ICD-10-CM | POA: Diagnosis not present

## 2022-02-03 DIAGNOSIS — E041 Nontoxic single thyroid nodule: Secondary | ICD-10-CM | POA: Diagnosis not present

## 2022-02-03 DIAGNOSIS — Z880 Allergy status to penicillin: Secondary | ICD-10-CM | POA: Diagnosis not present

## 2022-02-03 DIAGNOSIS — I10 Essential (primary) hypertension: Secondary | ICD-10-CM | POA: Diagnosis not present

## 2022-02-03 DIAGNOSIS — Z79899 Other long term (current) drug therapy: Secondary | ICD-10-CM | POA: Diagnosis not present

## 2022-02-03 DIAGNOSIS — C329 Malignant neoplasm of larynx, unspecified: Secondary | ICD-10-CM | POA: Diagnosis not present

## 2022-02-23 DIAGNOSIS — H2513 Age-related nuclear cataract, bilateral: Secondary | ICD-10-CM | POA: Diagnosis not present

## 2022-02-23 DIAGNOSIS — Z0100 Encounter for examination of eyes and vision without abnormal findings: Secondary | ICD-10-CM | POA: Diagnosis not present

## 2022-03-09 DIAGNOSIS — H2511 Age-related nuclear cataract, right eye: Secondary | ICD-10-CM | POA: Diagnosis not present

## 2022-04-17 ENCOUNTER — Encounter: Payer: Self-pay | Admitting: Ophthalmology

## 2022-04-21 NOTE — Discharge Instructions (Signed)

## 2022-04-22 ENCOUNTER — Encounter: Payer: Self-pay | Admitting: Ophthalmology

## 2022-04-22 ENCOUNTER — Other Ambulatory Visit: Payer: Self-pay

## 2022-04-22 ENCOUNTER — Ambulatory Visit: Payer: Medicare HMO | Admitting: Anesthesiology

## 2022-04-22 ENCOUNTER — Ambulatory Visit
Admission: RE | Admit: 2022-04-22 | Discharge: 2022-04-22 | Disposition: A | Discharge status: Home / Self Care | Payer: Medicare HMO | Attending: Ophthalmology | Admitting: Ophthalmology

## 2022-04-22 ENCOUNTER — Encounter
Admission: RE | Disposition: A | Discharge status: Home / Self Care | Payer: Self-pay | Source: Home / Self Care | Attending: Ophthalmology

## 2022-04-22 DIAGNOSIS — E785 Hyperlipidemia, unspecified: Secondary | ICD-10-CM | POA: Insufficient documentation

## 2022-04-22 DIAGNOSIS — H2511 Age-related nuclear cataract, right eye: Secondary | ICD-10-CM | POA: Insufficient documentation

## 2022-04-22 DIAGNOSIS — I10 Essential (primary) hypertension: Secondary | ICD-10-CM | POA: Diagnosis not present

## 2022-04-22 DIAGNOSIS — K219 Gastro-esophageal reflux disease without esophagitis: Secondary | ICD-10-CM | POA: Diagnosis not present

## 2022-04-22 HISTORY — PX: CATARACT EXTRACTION W/PHACO: SHX586

## 2022-04-22 HISTORY — DX: Presence of dental prosthetic device (complete) (partial): Z97.2

## 2022-04-22 HISTORY — DX: Gastro-esophageal reflux disease without esophagitis: K21.9

## 2022-04-22 SURGERY — PHACOEMULSIFICATION, CATARACT, WITH IOL INSERTION
Anesthesia: Monitor Anesthesia Care | Site: Eye | Laterality: Right

## 2022-04-22 MED ORDER — BRIMONIDINE TARTRATE-TIMOLOL 0.2-0.5 % OP SOLN
OPHTHALMIC | Status: DC | PRN
  Administered 2022-04-22: 1 [drp] via OPHTHALMIC

## 2022-04-22 MED ORDER — LACTATED RINGERS IV SOLN: INTRAVENOUS | Status: DC

## 2022-04-22 MED ORDER — FENTANYL CITRATE (PF) 100 MCG/2ML IJ SOLN
INTRAMUSCULAR | Status: DC | PRN
  Administered 2022-04-22: 50 ug via INTRAVENOUS

## 2022-04-22 MED ORDER — MIDAZOLAM HCL 2 MG/2ML IJ SOLN
INTRAMUSCULAR | Status: DC | PRN
  Administered 2022-04-22: 1 mg via INTRAVENOUS

## 2022-04-22 MED ORDER — SIGHTPATH DOSE#1 BSS IO SOLN
INTRAOCULAR | Status: DC | PRN
  Administered 2022-04-22: 15 mL

## 2022-04-22 MED ORDER — SIGHTPATH DOSE#1 BSS IO SOLN
INTRAOCULAR | Status: DC | PRN
  Administered 2022-04-22: 1 mL via INTRAMUSCULAR

## 2022-04-22 MED ORDER — SIGHTPATH DOSE#1 NA HYALUR & NA CHOND-NA HYALUR IO KIT
PACK | INTRAOCULAR | Status: DC | PRN
  Administered 2022-04-22: 1 via OPHTHALMIC

## 2022-04-22 MED ORDER — MOXIFLOXACIN HCL 0.5 % OP SOLN
OPHTHALMIC | Status: DC | PRN
  Administered 2022-04-22: 0.2 mL via OPHTHALMIC

## 2022-04-22 MED ORDER — TETRACAINE HCL 0.5 % OP SOLN
1.0000 [drp] | OPHTHALMIC | Status: DC | PRN
  Administered 2022-04-22 (×3): 1 [drp] via OPHTHALMIC

## 2022-04-22 MED ORDER — SIGHTPATH DOSE#1 BSS IO SOLN
INTRAOCULAR | Status: DC | PRN
  Administered 2022-04-22: 61 mL via OPHTHALMIC

## 2022-04-22 MED ORDER — ARMC OPHTHALMIC DILATING DROPS
1.0000 | OPHTHALMIC | Status: DC | PRN
  Administered 2022-04-22 (×3): 1 via OPHTHALMIC

## 2022-04-22 SURGICAL SUPPLY — 10 items
CATARACT SUITE SIGHTPATH (MISCELLANEOUS) ×1 IMPLANT
FEE CATARACT SUITE SIGHTPATH (MISCELLANEOUS) ×1 IMPLANT
GLOVE SRG 8 PF TXTR STRL LF DI (GLOVE) ×1 IMPLANT
GLOVE SURG ENC TEXT LTX SZ7.5 (GLOVE) ×1 IMPLANT
GLOVE SURG UNDER POLY LF SZ8 (GLOVE) ×1
LENS IOL TECNIS EYHANCE 22.0 (Intraocular Lens) IMPLANT
NDL FILTER BLUNT 18X1 1/2 (NEEDLE) ×1 IMPLANT
NEEDLE FILTER BLUNT 18X1 1/2 (NEEDLE) ×1 IMPLANT
SYR 3ML LL SCALE MARK (SYRINGE) ×1 IMPLANT
WATER STERILE IRR 250ML POUR (IV SOLUTION) ×1 IMPLANT

## 2022-04-22 NOTE — Op Note (Signed)
LOCATION:  [place]   PREOPERATIVE DIAGNOSIS:    Nuclear sclerotic cataract right eye. H25.11   POSTOPERATIVE DIAGNOSIS:  Nuclear sclerotic cataract right eye.     PROCEDURE:  Phacoemusification with posterior chamber intraocular lens placement of the right eye   ULTRASOUND TIME: Procedure(s) with comments: CATARACT EXTRACTION PHACO AND INTRAOCULAR LENS PLACEMENT (IOC) RIGHT (Right) - 7.94 01:03.7  LENS:   Implant Name Type Inv. Item Serial No. Manufacturer Lot No. LRB No. Used Action  LENS IOL TECNIS EYHANCE 22.0 - [id number] Intraocular Lens LENS IOL TECNIS EYHANCE 22.0 [id number] [employer]  Right 1 Implanted         SURGEON:  Wyonia Hough, MD   ANESTHESIA:  Topical with tetracaine drops and 2% Xylocaine jelly, augmented with 1% preservative-free intracameral lidocaine.    COMPLICATIONS:  None.   DESCRIPTION OF PROCEDURE:  The patient was identified in the holding room and transported to the operating room and placed in the supine position under the operating microscope.  The right eye was identified as the operative eye and it was prepped and draped in the usual sterile ophthalmic fashion.   A 1 millimeter clear-corneal paracentesis was made at the 12:00 position.  0.5 ml of preservative-free 1% lidocaine was injected into the anterior chamber. The anterior chamber was filled with Viscoat viscoelastic.  A 2.4 millimeter keratome was used to make a near-clear corneal incision at the 9:00 position.  A curvilinear capsulorrhexis was made with a cystotome and capsulorrhexis forceps.  Balanced salt solution was used to hydrodissect and hydrodelineate the nucleus.   Phacoemulsification was then used in stop and chop fashion to remove the lens nucleus and epinucleus.  The remaining cortex was then removed using the irrigation and aspiration handpiece. Provisc was then placed into the capsular bag to distend it for lens placement.  A lens was then injected into the  capsular bag.  The remaining viscoelastic was aspirated.   Wounds were hydrated with balanced salt solution.  The anterior chamber was inflated to a physiologic pressure with balanced salt solution.  No wound leaks were noted. Vigamox 0.2 ml of a '1mg'$  per ml solution was injected into the anterior chamber for a dose of 0.2 mg of intracameral antibiotic at the completion of the case.   Timolol and Brimonidine drops were applied to the eye.  The patient was taken to the recovery room in stable condition without complications of anesthesia or surgery.   Prynce Jacober 04/22/2022, 9:25 AM

## 2022-04-22 NOTE — Anesthesia Postprocedure Evaluation (Signed)
Anesthesia Post Note  Patient: EULALIA ELLERMAN  Procedure(s) Performed: CATARACT EXTRACTION PHACO AND INTRAOCULAR LENS PLACEMENT (IOC) RIGHT (Right: Eye)  Patient location during evaluation: PACU Anesthesia Type: MAC Level of consciousness: awake and alert Pain management: pain level controlled Vital Signs Assessment: post-procedure vital signs reviewed and stable Respiratory status: spontaneous breathing, nonlabored ventilation, respiratory function stable and patient connected to nasal cannula oxygen Cardiovascular status: stable and blood pressure returned to baseline Postop Assessment: no apparent nausea or vomiting Anesthetic complications: no   There were no known notable events for this encounter.   Last Vitals:  Vitals:   04/22/22 0928 04/22/22 0930  BP: 94/71 104/79  Pulse: (!) 59 (!) 58  Resp: (!) 7 14  Temp: (!) 36.1 C   SpO2: 100% 95%    Last Pain:  Vitals:   04/22/22 0928  TempSrc:   PainSc: 0-No pain                 Martha Clan

## 2022-04-22 NOTE — Transfer of Care (Signed)
Immediate Anesthesia Transfer of Care Note  Patient: Kim Potter  Procedure(s) Performed: CATARACT EXTRACTION PHACO AND INTRAOCULAR LENS PLACEMENT (IOC) RIGHT (Right: Eye)  Patient Location: PACU  Anesthesia Type: MAC  Level of Consciousness: awake, alert  and patient cooperative  Airway and Oxygen Therapy: Patient Spontanous Breathing and Patient connected to supplemental oxygen  Post-op Assessment: Post-op Vital signs reviewed, Patient's Cardiovascular Status Stable, Respiratory Function Stable, Patent Airway and No signs of Nausea or vomiting  Post-op Vital Signs: Reviewed and stable  Complications: There were no known notable events for this encounter.

## 2022-04-22 NOTE — Anesthesia Preprocedure Evaluation (Signed)
Anesthesia Evaluation  Patient identified by MRN, date of birth, ID band Patient awake    Reviewed: Allergy & Precautions, NPO status , Patient's Chart, lab work & pertinent test results  History of Anesthesia Complications Negative for: history of anesthetic complications  Airway Mallampati: I  TM Distance: >3 FB     Dental  (+) Upper Dentures, Partial Lower, Edentulous Upper   Pulmonary neg pulmonary ROS,    Pulmonary exam normal        Cardiovascular Exercise Tolerance: Good hypertension, (-) angina(-) Past MI and (-) Cardiac Stents Normal cardiovascular exam(-) dysrhythmias + Valvular Problems/Murmurs      Neuro/Psych negative neurological ROS  negative psych ROS   GI/Hepatic Neg liver ROS, GERD  ,  Endo/Other  negative endocrine ROS  Renal/GU negative Renal ROS  negative genitourinary   Musculoskeletal negative musculoskeletal ROS (+)   Abdominal Normal abdominal exam  (+)   Peds negative pediatric ROS (+)  Hematology negative hematology ROS (+)   Anesthesia Other Findings Past Medical History: No date: Hyperlipidemia No date: Hypertension  Reproductive/Obstetrics negative OB ROS                             Anesthesia Physical  Anesthesia Plan  ASA: 2  Anesthesia Plan: MAC   Post-op Pain Management:    Induction: Intravenous  PONV Risk Score and Plan: 2 and Midazolam  Airway Management Planned: Natural Airway and Nasal Cannula  Additional Equipment:   Intra-op Plan:   Post-operative Plan:   Informed Consent: I have reviewed the patients History and Physical, chart, labs and discussed the procedure including the risks, benefits and alternatives for the proposed anesthesia with the patient or authorized representative who has indicated his/her understanding and acceptance.       Plan Discussed with: CRNA and Surgeon  Anesthesia Plan Comments:          Anesthesia Quick Evaluation

## 2022-04-22 NOTE — H&P (Signed)
[facility]   Primary Care Physician:  Dion Body, MD Ophthalmologist: Dr. Leandrew Koyanagi  Pre-Procedure History & Physical: HPI:  Kim Potter is a 71 y.o. female here for ophthalmic surgery.   Past Medical History:  Diagnosis Date   GERD (gastroesophageal reflux disease)    Hyperlipidemia    Hypertension    Wears dentures    full upper and lower    Past Surgical History:  Procedure Laterality Date   LAPAROTOMY N/A 12/29/2019   Procedure: EXPLORATORY LAPAROTOMY;  Surgeon: Benjamine Sprague, DO;  Location: [facility] ORS;  Service: General;  Laterality: N/A;   LYSIS OF ADHESION N/A 12/29/2019   Procedure: LYSIS OF ADHESION;  Surgeon: Benjamine Sprague, DO;  Location: [facility] ORS;  Service: General;  Laterality: N/A;   PARTIAL HYSTERECTOMY      Prior to Admission medications   Medication Sig Start Date End Date Taking? Authorizing Provider  acetaminophen (TYLENOL) 325 MG tablet Take by mouth as needed. 04/18/19  Yes [provider]  famotidine (PEPCID) 40 MG tablet Take 40 mg by mouth daily. 11/30/19 04/22/22 Yes [provider]  fexofenadine (ALLEGRA) 180 MG tablet Take by mouth.   Yes [provider]  fluticasone (FLONASE) 50 MCG/ACT nasal spray Place 1 spray into both nostrils daily.   Yes [provider]  hydrochlorothiazide (HYDRODIURIL) 25 MG tablet Take 25 mg by mouth daily.   Yes [provider]  metoprolol succinate (TOPROL-XL) 50 MG 24 hr tablet Take 50 mg by mouth daily.  06/15/19 04/22/22 Yes [provider]  simvastatin (ZOCOR) 20 MG tablet Take 20 mg by mouth at bedtime.    Yes [provider]    Allergies as of 02/27/2022 - Review Complete 02/15/2020  Allergen Reaction Noted   Penicillins Itching and Rash 01/08/2016    Family History  Problem Relation Age of Onset   Diabetes Brother    Kidney disease Brother    Breast cancer Neg Hx     Social History   Socioeconomic History   Marital status:  Divorced    Spouse name: Not on file   Number of children: Not on file   Years of education: Not on file   Highest education level: Not on file  Occupational History   Not on file  Tobacco Use   Smoking status: Never   Smokeless tobacco: Never  Vaping Use   Vaping Use: Never used  Substance and Sexual Activity   Alcohol use: No   Drug use: No   Sexual activity: Not on file  Other Topics Concern   Not on file  Social History Narrative   Not on file   Social Determinants of Health   Financial Resource Strain: Not on file  Food Insecurity: Not on file  Transportation Needs: Not on file  Physical Activity: Not on file  Stress: Not on file  Social Connections: Not on file  Intimate Partner Violence: Not on file    Review of Systems: See HPI, otherwise negative ROS  Physical Exam: BP 136/82   Pulse 72   Temp (!) 97.2 F (36.2 C) (Temporal)   Resp 18   Ht '5\' 1"'$  (1.549 m)   Wt 51.7 kg   LMP 01/08/1979 (Approximate)   SpO2 100%   BMI 21.54 kg/m  General:   Alert,  pleasant and cooperative in NAD Head:  Normocephalic and atraumatic. Lungs:  Clear to auscultation.    Heart:  Regular rate and rhythm.   Impression/Plan: Kim Potter is  here for ophthalmic surgery.  Risks, benefits, limitations, and alternatives regarding ophthalmic surgery have been reviewed with the patient.  Questions have been answered.  All parties agreeable.   Leandrew Koyanagi, MD  04/22/2022, 8:39 AM

## 2022-04-22 NOTE — Anesthesia Preprocedure Evaluation (Deleted)

## 2022-04-23 ENCOUNTER — Encounter: Payer: Self-pay | Admitting: Ophthalmology

## 2022-05-05 DIAGNOSIS — E7800 Pure hypercholesterolemia, unspecified: Secondary | ICD-10-CM | POA: Diagnosis not present

## 2022-05-12 DIAGNOSIS — I10 Essential (primary) hypertension: Secondary | ICD-10-CM | POA: Diagnosis not present

## 2022-05-12 DIAGNOSIS — E7800 Pure hypercholesterolemia, unspecified: Secondary | ICD-10-CM | POA: Diagnosis not present

## 2022-05-12 DIAGNOSIS — Z87898 Personal history of other specified conditions: Secondary | ICD-10-CM | POA: Diagnosis not present

## 2022-06-24 ENCOUNTER — Emergency Department: Payer: Medicare HMO

## 2022-06-24 ENCOUNTER — Encounter: Payer: Self-pay | Admitting: *Deleted

## 2022-06-24 ENCOUNTER — Other Ambulatory Visit: Payer: Self-pay

## 2022-06-24 DIAGNOSIS — I10 Essential (primary) hypertension: Secondary | ICD-10-CM | POA: Insufficient documentation

## 2022-06-24 DIAGNOSIS — R55 Syncope and collapse: Secondary | ICD-10-CM | POA: Diagnosis not present

## 2022-06-24 DIAGNOSIS — Z20822 Contact with and (suspected) exposure to covid-19: Secondary | ICD-10-CM | POA: Diagnosis not present

## 2022-06-24 DIAGNOSIS — Z1152 Encounter for screening for COVID-19: Secondary | ICD-10-CM | POA: Insufficient documentation

## 2022-06-24 DIAGNOSIS — E876 Hypokalemia: Secondary | ICD-10-CM | POA: Diagnosis not present

## 2022-06-24 DIAGNOSIS — R059 Cough, unspecified: Secondary | ICD-10-CM | POA: Diagnosis not present

## 2022-06-24 DIAGNOSIS — N390 Urinary tract infection, site not specified: Secondary | ICD-10-CM | POA: Diagnosis not present

## 2022-06-24 LAB — BASIC METABOLIC PANEL
Anion gap: 10 (ref 5–15)
BUN: 17 mg/dL (ref 8–23)
CO2: 30 mmol/L (ref 22–32)
Calcium: 9.8 mg/dL (ref 8.9–10.3)
Chloride: 99 mmol/L (ref 98–111)
Creatinine, Ser: 0.9 mg/dL (ref 0.44–1.00)
GFR, Estimated: >60 mL/min (ref >60)
Glucose, Bld: 103 mg/dL — ABNORMAL HIGH (ref 70–99)
Potassium: 3.1 mmol/L — ABNORMAL LOW (ref 3.5–5.1)
Sodium: 139 mmol/L (ref 135–145)

## 2022-06-24 LAB — URINALYSIS, ROUTINE W REFLEX MICROSCOPIC
Bacteria, UA: NONE SEEN
Bilirubin Urine: NEGATIVE
Glucose, UA: NEGATIVE mg/dL
Ketones, ur: NEGATIVE mg/dL
Nitrite: NEGATIVE
Protein, ur: NEGATIVE mg/dL
Specific Gravity, Urine: 1.014 (ref 1.005–1.030)
WBC, UA: >50 WBC/hpf — ABNORMAL HIGH (ref 0–5)
pH: 5 (ref 5.0–8.0)

## 2022-06-24 LAB — CBC
HCT: 39.8 % (ref 36.0–46.0)
Hemoglobin: 13 g/dL (ref 12.0–15.0)
MCH: 23.2 pg — ABNORMAL LOW (ref 26.0–34.0)
MCHC: 32.7 g/dL (ref 30.0–36.0)
MCV: 71.1 fL — ABNORMAL LOW (ref 80.0–100.0)
Platelets: 200 10*3/uL (ref 150–400)
RBC: 5.6 MIL/uL — ABNORMAL HIGH (ref 3.87–5.11)
RDW: 15.7 % — ABNORMAL HIGH (ref 11.5–15.5)
WBC: 5.2 10*3/uL (ref 4.0–10.5)
nRBC: 0 % (ref 0.0–0.2)

## 2022-06-24 LAB — RESP PANEL BY RT-PCR (RSV, FLU A&B, COVID)  RVPGX2
Influenza A by PCR: NEGATIVE
Influenza B by PCR: NEGATIVE
Resp Syncytial Virus by PCR: NEGATIVE
SARS Coronavirus 2 by RT PCR: NEGATIVE

## 2022-06-24 LAB — TROPONIN I (HIGH SENSITIVITY)
Troponin I (High Sensitivity): 3 ng/L (ref <18)
Troponin I (High Sensitivity): 4 ng/L (ref <18)

## 2022-06-24 LAB — MAGNESIUM: Magnesium: 2.1 mg/dL (ref 1.7–2.4)

## 2022-06-24 NOTE — ED Triage Notes (Signed)
Pt reports near syncope 1 week ago and states it happened again today.  No n/v.  No chest pain.  Pt has fullness in chest.  Pt has a cough  no fever  nonsmoker.  Pt alert  speech clear.

## 2022-06-25 ENCOUNTER — Emergency Department
Admission: EM | Admit: 2022-06-25 | Discharge: 2022-06-25 | Disposition: A | Discharge status: Home / Self Care | Payer: Medicare HMO | Attending: Emergency Medicine | Admitting: Emergency Medicine

## 2022-06-25 DIAGNOSIS — N390 Urinary tract infection, site not specified: Secondary | ICD-10-CM

## 2022-06-25 DIAGNOSIS — E876 Hypokalemia: Secondary | ICD-10-CM

## 2022-06-25 DIAGNOSIS — R55 Syncope and collapse: Secondary | ICD-10-CM

## 2022-06-25 MED ORDER — FLUCONAZOLE 50 MG PO TABS
150.0000 mg | ORAL_TABLET | Freq: Once | ORAL | Status: AC
  Administered 2022-06-25: 150 mg via ORAL
  Filled 2022-06-25: qty 1

## 2022-06-25 MED ORDER — FLUCONAZOLE 150 MG PO TABS: 150.0000 mg | ORAL_TABLET | Freq: Every day | ORAL | 0 refills | Status: DC

## 2022-06-25 MED ORDER — POTASSIUM CHLORIDE CRYS ER 20 MEQ PO TBCR
40.0000 meq | EXTENDED_RELEASE_TABLET | Freq: Once | ORAL | Status: DC
  Filled 2022-06-25: qty 2

## 2022-06-25 MED ORDER — CEPHALEXIN 500 MG PO CAPS: 500.0000 mg | ORAL_CAPSULE | Freq: Two times a day (BID) | ORAL | 0 refills | Status: DC

## 2022-06-25 MED ORDER — SODIUM CHLORIDE 0.9 % IV BOLUS (SEPSIS)
1000.0000 mL | Freq: Once | INTRAVENOUS | Status: AC
  Administered 2022-06-25: 1000 mL via INTRAVENOUS

## 2022-06-25 MED ORDER — POTASSIUM CHLORIDE 20 MEQ PO PACK
40.0000 meq | PACK | Freq: Once | ORAL | Status: AC
  Administered 2022-06-25: 40 meq via ORAL
  Filled 2022-06-25: qty 2

## 2022-06-25 MED ORDER — SODIUM CHLORIDE 0.9 % IV SOLN
1.0000 g | Freq: Once | INTRAVENOUS | Status: AC
  Administered 2022-06-25: 1 g via INTRAVENOUS
  Filled 2022-06-25: qty 10

## 2022-06-25 NOTE — Discharge Instructions (Addendum)
Your lab work today was reassuring other than potassium level of 3.1 which we have given you replacement for.  You have a UTI.  Please take antibiotics until complete.  Your cardiac workup was reassuring but I do recommend close follow-up with your doctor.  I recommend increase fluid intake over the next several days.

## 2022-06-25 NOTE — ED Provider Notes (Signed)
[facility] Provider Note    Event Date/Time   First MD Initiated Contact with Patient 06/25/22 0133     (approximate)   History   Near Syncope   HPI  Kim Potter is a 71 y.o. female with history of hypertension, hyperlipidemia, GERD who presents to the emergency department with 2 near syncopal events that occurred this week.  She states that she will be standing and suddenly felt flushed all over and lightheaded.  No loss of consciousness.  Did have some shortness of breath but no chest pain, palpitations.  No vomiting or diarrhea.  States she has been eating and drinking well.  No changes in medications.   History provided by patient.    Past Medical History:  Diagnosis Date   GERD (gastroesophageal reflux disease)    Hyperlipidemia    Hypertension    Wears dentures    full upper and lower    Past Surgical History:  Procedure Laterality Date   CATARACT EXTRACTION W/PHACO Right 04/22/2022   Procedure: CATARACT EXTRACTION PHACO AND INTRAOCULAR LENS PLACEMENT ([place]) RIGHT;  Surgeon: Leandrew Koyanagi, MD;  Location: [facility];  Service: Ophthalmology;  Laterality: Right;  7.94 01:03.7   LAPAROTOMY N/A 12/29/2019   Procedure: EXPLORATORY LAPAROTOMY;  Surgeon: Benjamine Sprague, DO;  Location: [facility];  Service: General;  Laterality: N/A;   LYSIS OF ADHESION N/A 12/29/2019   Procedure: LYSIS OF ADHESION;  Surgeon: Benjamine Sprague, DO;  Location: [facility];  Service: General;  Laterality: N/A;   PARTIAL HYSTERECTOMY      MEDICATIONS:  Prior to Admission medications   Medication Sig Start Date End Date Taking? Authorizing Provider  acetaminophen (TYLENOL) 325 MG tablet Take by mouth as needed. 04/18/19   [provider]  famotidine (PEPCID) 40 MG tablet Take 40 mg by mouth daily. 11/30/19 04/22/22  [provider]  fexofenadine (ALLEGRA) 180 MG tablet Take by mouth.    [provider]  fluticasone (FLONASE) 50 MCG/ACT  nasal spray Place 1 spray into both nostrils daily.    [provider]  hydrochlorothiazide (HYDRODIURIL) 25 MG tablet Take 25 mg by mouth daily.    [provider]  metoprolol succinate (TOPROL-XL) 50 MG 24 hr tablet Take 50 mg by mouth daily.  06/15/19 04/22/22  [provider]  simvastatin (ZOCOR) 20 MG tablet Take 20 mg by mouth at bedtime.     [provider]    Physical Exam   Triage Vital Signs: ED Triage Vitals  Enc Vitals Group     BP 06/24/22 1536 (!) 172/79     Pulse Rate 06/24/22 1536 78     Resp 06/24/22 1536 20     Temp 06/24/22 1536 97.8 F (36.6 C)     Temp Source 06/24/22 2011 Oral     SpO2 06/24/22 1536 100 %     Weight 06/24/22 1532 112 lb (50.8 kg)     Height 06/24/22 1532 '5\' 1"'$  (1.549 m)     Head Circumference --      Peak Flow --      Pain Score 06/24/22 1532 3     Pain Loc --      Pain Edu? --      Excl. in [place]? --     Most recent vital signs: Vitals:   06/24/22 2220 06/25/22 0232  BP: 135/89 138/88  Pulse: 67 (!) 53  Resp: 19 19  Temp: 98.5 F (36.9 C) 98.5 F (36.9 C)  SpO2: 100% 100%    CONSTITUTIONAL: Alert and oriented and responds appropriately to questions. Well-appearing; well-nourished, extremely pleasant HEAD: Normocephalic, atraumatic EYES: Conjunctivae clear, pupils appear equal, sclera nonicteric ENT: normal nose; moist mucous membranes NECK: Supple, normal ROM CARD: RRR; S1 and S2 appreciated; no murmurs, no clicks, no rubs, no gallops RESP: Normal chest excursion without splinting or tachypnea; breath sounds clear and equal bilaterally; no wheezes, no rhonchi, no rales, no hypoxia or respiratory distress, speaking full sentences ABD/GI: Normal bowel sounds; non-distended; soft, non-tender, no rebound, no guarding, no peritoneal signs BACK: The back appears normal EXT: Normal ROM in all joints; no deformity noted, no edema; no cyanosis SKIN: Normal color for age and race; warm; no rash on exposed  skin NEURO: Moves all extremities equally, normal speech PSYCH: The patient's mood and manner are appropriate.   ED Results / Procedures / Treatments   LABS: (all labs ordered are listed, but only abnormal results are displayed) Labs Reviewed  BASIC METABOLIC PANEL - Abnormal; Notable for the following components:      Result Value   Potassium 3.1 (*)    Glucose, Bld 103 (*)    All other components within normal limits  CBC - Abnormal; Notable for the following components:   RBC 5.60 (*)    MCV 71.1 (*)    MCH 23.2 (*)    RDW 15.7 (*)    All other components within normal limits  URINALYSIS, ROUTINE W REFLEX MICROSCOPIC - Abnormal; Notable for the following components:   Color, Urine YELLOW (*)    APPearance CLOUDY (*)    Hgb urine dipstick SMALL (*)    Leukocytes,Ua LARGE (*)    WBC, UA >50 (*)    All other components within normal limits  RESP PANEL BY RT-PCR (RSV, FLU A&B, COVID)  [id number]  URINE CULTURE  MAGNESIUM  TROPONIN I (HIGH SENSITIVITY)  TROPONIN I (HIGH SENSITIVITY)     EKG:  EKG Interpretation  Date/Time:  Wednesday June 24 2022 15:34:58 EST Ventricular Rate:  76 PR Interval:  154 QRS Duration: 82 QT Interval:  402 QTC Calculation: 452 R Axis:   54 Text Interpretation: Normal sinus rhythm Biatrial enlargement Abnormal ECG When compared with ECG of 15-Feb-2020 16:16, No significant change was found Confirmed by Pryor Curia [phone]) on 06/25/2022 1:36:27 AM         RADIOLOGY: My personal review and interpretation of imaging: Chest x-ray clear.  I have personally reviewed all radiology reports.   DG Chest 2 View  Result Date: 06/24/2022 CLINICAL DATA:  Near syncope, chest fullness, cough EXAM: CHEST - 2 VIEW COMPARISON:  02/15/2020 FINDINGS: Frontal and lateral views of the chest demonstrate an unremarkable cardiac silhouette. No acute airspace disease, effusion, or pneumothorax. No acute bony abnormality. IMPRESSION: 1. No acute intrathoracic  process. Electronically Signed   By: Randa Ngo M.D.   On: 06/24/2022 16:23     PROCEDURES:  Critical Care performed: No     .1-3 Lead EKG Interpretation  Performed by: Vivi Piccirilli, Delice Bison, DO Authorized by: Luis Sami, Delice Bison, DO     Interpretation: abnormal     ECG rate:  53   ECG rate assessment: bradycardic     Rhythm: sinus bradycardia     Ectopy: none     Conduction: normal       IMPRESSION / MDM / ASSESSMENT AND PLAN / ED COURSE  I reviewed the triage vital signs and the nursing notes.    Patient here with near  syncopal event today and 1 last week.  Currently asymptomatic.  The patient is on the cardiac monitor to evaluate for evidence of arrhythmia and/or significant heart rate changes.   DIFFERENTIAL DIAGNOSIS (includes but not limited to):   ACS, arrhythmia, PE, dehydration, anemia, electrolyte derangement, UTI   Patient's presentation is most consistent with acute presentation with potential threat to life or bodily function.   PLAN: Workup initiated from triage.  Normal hemoglobin.  Potassium 3.1.  Given oral replacement.  Added on magnesium level which is normal.  Troponin x 2 negative.  She does appear to have a UTI.  Will give Rocephin and add on urine culture.  Chest x-ray reviewed and interpreted by myself and the radiologist and shows no acute abnormality.  Will give IV fluids and reassess, check orthostats.   MEDICATIONS GIVEN IN ED: Medications  cefTRIAXone (ROCEPHIN) 1 g in sodium chloride 0.9 % 100 mL IVPB (1 g Intravenous New Bag/Given 06/25/22 0223)  sodium chloride 0.9 % bolus 1,000 mL (1,000 mLs Intravenous New Bag/Given 06/25/22 0223)  fluconazole (DIFLUCAN) tablet 150 mg (150 mg Oral Given 06/25/22 0235)  potassium chloride (KLOR-CON) packet 40 mEq (40 mEq Oral Given 06/25/22 0235)     ED COURSE: Patient able to ambulate here without difficulty, tolerated p.o.  Orthostats negative.  Patient still asymptomatic.  COVID, flu and RSV negative.   I feel she is safe for discharge home with outpatient follow-up.   At this time, I do not feel there is any life-threatening condition present. I reviewed all nursing notes, vitals, pertinent previous records.  All lab and urine results, EKGs, imaging ordered have been independently reviewed and interpreted by myself.  I reviewed all available radiology reports from any imaging ordered this visit.  Based on my assessment, I feel the patient is safe to be discharged home without further emergent workup and can continue workup as an outpatient as needed. Discussed all findings, treatment plan as well as usual and customary return precautions.  They verbalize understanding and are comfortable with this plan.  Outpatient follow-up has been provided as needed.  All questions have been answered.    CONSULTS:  none   OUTSIDE RECORDS REVIEWED: Reviewed patient's last office visit with Dr. Netty Starring on 05/12/2022.       FINAL CLINICAL IMPRESSION(S) / ED DIAGNOSES   Final diagnoses:  Near syncope  Acute UTI  Hypokalemia     Rx / DC Orders   ED Discharge Orders          Ordered    cephALEXin (KEFLEX) 500 MG capsule  2 times daily        06/25/22 2993             Note:  This document was prepared using Dragon voice recognition software and may include unintentional dictation errors.   Arieon Scalzo, Delice Bison, DO 06/25/22 [phone]

## 2022-06-26 LAB — URINE CULTURE

## 2022-07-13 DIAGNOSIS — R55 Syncope and collapse: Secondary | ICD-10-CM | POA: Diagnosis not present

## 2022-07-13 DIAGNOSIS — Z87440 Personal history of urinary (tract) infections: Secondary | ICD-10-CM | POA: Diagnosis not present

## 2022-07-13 DIAGNOSIS — E876 Hypokalemia: Secondary | ICD-10-CM | POA: Diagnosis not present

## 2022-07-16 DIAGNOSIS — N390 Urinary tract infection, site not specified: Secondary | ICD-10-CM | POA: Diagnosis not present

## 2022-07-16 DIAGNOSIS — Z87440 Personal history of urinary (tract) infections: Secondary | ICD-10-CM | POA: Diagnosis not present

## 2022-07-24 DIAGNOSIS — R55 Syncope and collapse: Secondary | ICD-10-CM | POA: Diagnosis not present

## 2022-07-28 DIAGNOSIS — H209 Unspecified iridocyclitis: Secondary | ICD-10-CM | POA: Diagnosis not present

## 2022-08-11 DIAGNOSIS — H209 Unspecified iridocyclitis: Secondary | ICD-10-CM | POA: Diagnosis not present

## 2022-08-14 DIAGNOSIS — R946 Abnormal results of thyroid function studies: Secondary | ICD-10-CM | POA: Diagnosis not present

## 2022-08-14 DIAGNOSIS — E039 Hypothyroidism, unspecified: Secondary | ICD-10-CM | POA: Diagnosis not present

## 2022-08-14 DIAGNOSIS — J387 Other diseases of larynx: Secondary | ICD-10-CM | POA: Diagnosis not present

## 2022-08-14 DIAGNOSIS — C329 Malignant neoplasm of larynx, unspecified: Secondary | ICD-10-CM | POA: Diagnosis not present

## 2022-08-14 DIAGNOSIS — C320 Malignant neoplasm of glottis: Secondary | ICD-10-CM | POA: Diagnosis not present

## 2022-08-14 DIAGNOSIS — C328 Malignant neoplasm of overlapping sites of larynx: Secondary | ICD-10-CM | POA: Diagnosis not present

## 2022-08-14 DIAGNOSIS — Z923 Personal history of irradiation: Secondary | ICD-10-CM | POA: Diagnosis not present

## 2022-09-14 DIAGNOSIS — H209 Unspecified iridocyclitis: Secondary | ICD-10-CM | POA: Diagnosis not present

## 2022-10-26 DIAGNOSIS — H209 Unspecified iridocyclitis: Secondary | ICD-10-CM | POA: Diagnosis not present

## 2022-11-05 DIAGNOSIS — H5213 Myopia, bilateral: Secondary | ICD-10-CM | POA: Diagnosis not present

## 2022-11-06 DIAGNOSIS — H209 Unspecified iridocyclitis: Secondary | ICD-10-CM | POA: Diagnosis not present

## 2022-11-10 DIAGNOSIS — E7800 Pure hypercholesterolemia, unspecified: Secondary | ICD-10-CM | POA: Diagnosis not present

## 2022-11-10 DIAGNOSIS — I10 Essential (primary) hypertension: Secondary | ICD-10-CM | POA: Diagnosis not present

## 2022-11-17 DIAGNOSIS — Z0000 Encounter for general adult medical examination without abnormal findings: Secondary | ICD-10-CM | POA: Diagnosis not present

## 2022-11-17 DIAGNOSIS — Z1331 Encounter for screening for depression: Secondary | ICD-10-CM | POA: Diagnosis not present

## 2022-11-17 DIAGNOSIS — E785 Hyperlipidemia, unspecified: Secondary | ICD-10-CM | POA: Diagnosis not present

## 2022-11-17 DIAGNOSIS — Z780 Asymptomatic menopausal state: Secondary | ICD-10-CM | POA: Diagnosis not present

## 2022-11-17 DIAGNOSIS — Z87898 Personal history of other specified conditions: Secondary | ICD-10-CM | POA: Diagnosis not present

## 2022-11-17 DIAGNOSIS — I10 Essential (primary) hypertension: Secondary | ICD-10-CM | POA: Diagnosis not present

## 2022-11-30 DIAGNOSIS — H209 Unspecified iridocyclitis: Secondary | ICD-10-CM | POA: Diagnosis not present

## 2022-11-30 DIAGNOSIS — Z961 Presence of intraocular lens: Secondary | ICD-10-CM | POA: Diagnosis not present

## 2022-11-30 DIAGNOSIS — H2512 Age-related nuclear cataract, left eye: Secondary | ICD-10-CM | POA: Diagnosis not present

## 2022-12-28 DIAGNOSIS — Z780 Asymptomatic menopausal state: Secondary | ICD-10-CM | POA: Diagnosis not present

## 2022-12-28 DIAGNOSIS — M810 Age-related osteoporosis without current pathological fracture: Secondary | ICD-10-CM | POA: Diagnosis not present

## 2023-01-05 DIAGNOSIS — Z1231 Encounter for screening mammogram for malignant neoplasm of breast: Secondary | ICD-10-CM | POA: Diagnosis not present

## 2023-02-18 DIAGNOSIS — Z880 Allergy status to penicillin: Secondary | ICD-10-CM | POA: Diagnosis not present

## 2023-02-18 DIAGNOSIS — Z79899 Other long term (current) drug therapy: Secondary | ICD-10-CM | POA: Diagnosis not present

## 2023-02-18 DIAGNOSIS — E041 Nontoxic single thyroid nodule: Secondary | ICD-10-CM | POA: Diagnosis not present

## 2023-02-18 DIAGNOSIS — I10 Essential (primary) hypertension: Secondary | ICD-10-CM | POA: Diagnosis not present

## 2023-02-18 DIAGNOSIS — C329 Malignant neoplasm of larynx, unspecified: Secondary | ICD-10-CM | POA: Diagnosis not present

## 2023-03-02 DIAGNOSIS — H2011 Chronic iridocyclitis, right eye: Secondary | ICD-10-CM | POA: Diagnosis not present

## 2023-03-23 DIAGNOSIS — H2011 Chronic iridocyclitis, right eye: Secondary | ICD-10-CM | POA: Diagnosis not present

## 2023-04-26 DIAGNOSIS — H2011 Chronic iridocyclitis, right eye: Secondary | ICD-10-CM | POA: Diagnosis not present

## 2023-04-26 DIAGNOSIS — H2512 Age-related nuclear cataract, left eye: Secondary | ICD-10-CM | POA: Diagnosis not present

## 2023-04-26 DIAGNOSIS — Z961 Presence of intraocular lens: Secondary | ICD-10-CM | POA: Diagnosis not present

## 2023-05-11 DIAGNOSIS — E7800 Pure hypercholesterolemia, unspecified: Secondary | ICD-10-CM | POA: Diagnosis not present

## 2023-05-11 DIAGNOSIS — I10 Essential (primary) hypertension: Secondary | ICD-10-CM | POA: Diagnosis not present

## 2023-05-17 DIAGNOSIS — M810 Age-related osteoporosis without current pathological fracture: Secondary | ICD-10-CM | POA: Diagnosis not present

## 2023-05-17 DIAGNOSIS — D72819 Decreased white blood cell count, unspecified: Secondary | ICD-10-CM | POA: Diagnosis not present

## 2023-05-17 DIAGNOSIS — I10 Essential (primary) hypertension: Secondary | ICD-10-CM | POA: Diagnosis not present

## 2023-05-17 DIAGNOSIS — Z87898 Personal history of other specified conditions: Secondary | ICD-10-CM | POA: Diagnosis not present

## 2023-05-17 DIAGNOSIS — E7800 Pure hypercholesterolemia, unspecified: Secondary | ICD-10-CM | POA: Diagnosis not present

## 2023-06-15 DIAGNOSIS — H2011 Chronic iridocyclitis, right eye: Secondary | ICD-10-CM | POA: Diagnosis not present

## 2023-06-15 DIAGNOSIS — Z961 Presence of intraocular lens: Secondary | ICD-10-CM | POA: Diagnosis not present

## 2023-06-15 DIAGNOSIS — H2512 Age-related nuclear cataract, left eye: Secondary | ICD-10-CM | POA: Diagnosis not present

## 2023-07-29 DIAGNOSIS — M810 Age-related osteoporosis without current pathological fracture: Secondary | ICD-10-CM | POA: Diagnosis not present

## 2023-08-03 DIAGNOSIS — H2011 Chronic iridocyclitis, right eye: Secondary | ICD-10-CM | POA: Diagnosis not present

## 2023-09-07 DIAGNOSIS — Z8521 Personal history of malignant neoplasm of larynx: Secondary | ICD-10-CM | POA: Diagnosis not present

## 2023-09-07 DIAGNOSIS — C329 Malignant neoplasm of larynx, unspecified: Secondary | ICD-10-CM | POA: Diagnosis not present

## 2023-09-07 DIAGNOSIS — Z08 Encounter for follow-up examination after completed treatment for malignant neoplasm: Secondary | ICD-10-CM | POA: Diagnosis not present

## 2023-09-21 DIAGNOSIS — Z961 Presence of intraocular lens: Secondary | ICD-10-CM | POA: Diagnosis not present

## 2023-09-21 DIAGNOSIS — H2011 Chronic iridocyclitis, right eye: Secondary | ICD-10-CM | POA: Diagnosis not present

## 2023-09-21 DIAGNOSIS — H2512 Age-related nuclear cataract, left eye: Secondary | ICD-10-CM | POA: Diagnosis not present

## 2023-11-01 DIAGNOSIS — D72819 Decreased white blood cell count, unspecified: Secondary | ICD-10-CM | POA: Diagnosis not present

## 2023-11-01 DIAGNOSIS — R300 Dysuria: Secondary | ICD-10-CM | POA: Diagnosis not present

## 2023-11-01 DIAGNOSIS — E7800 Pure hypercholesterolemia, unspecified: Secondary | ICD-10-CM | POA: Diagnosis not present

## 2023-11-08 DIAGNOSIS — E785 Hyperlipidemia, unspecified: Secondary | ICD-10-CM | POA: Diagnosis not present

## 2023-11-08 DIAGNOSIS — I10 Essential (primary) hypertension: Secondary | ICD-10-CM | POA: Diagnosis not present

## 2023-11-08 DIAGNOSIS — Z1331 Encounter for screening for depression: Secondary | ICD-10-CM | POA: Diagnosis not present

## 2023-11-08 DIAGNOSIS — Z0000 Encounter for general adult medical examination without abnormal findings: Secondary | ICD-10-CM | POA: Diagnosis not present

## 2023-11-08 NOTE — Progress Notes (Signed)
 CC: Preventative Health Exam  HPI  Kim Potter is a 73 y.o. here for preventative health exam and subsequent medicare wellness  Preventative health exam: No acute issues.  Chronic medical issues stable and tolerating medications without adverse effects.  Stays active and adheres to healthy diet.  No exertional cp or syncopal episodes.  No vaginal symptoms, urinary issues, or rectal pain/bleeding.  History of hysterectomy.  Denies any tobacco use.    ROS Review of systems is unremarkable for any active cardiac, respiratory, GI, GU, hematologic, neurologic, dermatologic, HEENT, or psychiatric symptoms except as noted above.  No fevers, chills, or constitutional symptoms.   Current Outpatient Medications  Medication Sig Dispense Refill  . acetaminophen  (TYLENOL ) 325 MG tablet Take 2 tablets by mouth every 6 (six) hours as needed    . amLODIPine  (NORVASC ) 5 MG tablet Take 1 tablet (5 mg total) by mouth once daily 100 tablet 0  . atropine 1 % ophthalmic solution INSTILL 1 DROP INTO RIGHT EYE EVERY NIGHT    . azelastine (OPTIVAR) 0.05 % ophthalmic solution Place 1 drop into both eyes 2 (two) times daily as needed    . calcium carbonate-vitamin D3 (CALTRATE 600+D) 600 mg-10 mcg (400 unit) tablet Take 1 tablet by mouth 2 (two) times daily with meals    . famotidine  (PEPCID ) 20 MG tablet Take 40 mg by mouth once daily as needed for Heartburn    . fluticasone  propionate (FLONASE ) 50 mcg/actuation nasal spray Place 1 spray into both nostrils once daily 16 g 3  . metoprolol  SUCCinate (TOPROL -XL) 50 MG XL tablet Take 1 tablet (50 mg total) by mouth once daily 100 tablet 1  . montelukast (SINGULAIR) 10 mg tablet TAKE 1 TABLET BY MOUTH AT BEDTIME 100 tablet 1  . polyethylene glycol (MIRALAX) powder Take 17 g by mouth once daily as needed for Constipation Mix in 4-8ounces of fluid prior to taking.    . prednisoLONE acetate (PRED FORTE) 1 % ophthalmic suspension Place 1 drop into the right eye 2 (two)  times daily    . psyllium husk, with sugar, (METAMUCIL) 3.4 gram/12 gram oral powder Take 3.4 g by mouth once daily as needed for Constipation Mix with a full glass (240 mL) of fluid.    . simvastatin  (ZOCOR ) 20 MG tablet Take 1 tablet by mouth nightly 90 tablet 0  . difluprednate (DUREZOL) 0.05 % ophthalmic emulsion APPLY 1 DROP INTO AFFECETED EYE TWICE A DAY FOR 2 WEEKS THEN 1 DROP A DAY FOR 2 WEEKS     No current facility-administered medications for this visit.    Allergies as of 11/08/2023 - Reviewed 11/08/2023  Allergen Reaction Noted  . Penicillins Itching and Rash 01/08/2016    Patient Active Problem List  Diagnosis  . History of laryngeal cancer - followed by [facility]  . History of tachycardia on metoprolol   . Pure hypercholesterolemia (LDL 102 - 05/11/23)  . Gastroesophageal reflux disease without esophagitis  . Medicare annual wellness visit, initial 10/24/20  . Essential hypertension  . Allergic rhinitis, mild  . Medicare annual wellness visit, subsequent 11/08/23  . Reactive airway disease without complication (HHS-HCC)  . Age-related osteoporosis without current pathological fracture (Dexa 12/28/22) - unable to swallow to fosamax    Past Medical History:  Diagnosis Date  . GERD (gastroesophageal reflux disease)   . History of cancer   . Hyperlipidemia   . Hypertension   . Osteoporosis   . Thyroid  disease    nodule only  Past Surgical History:  Procedure Laterality Date  . EXPLORATORY LAPAROTOMY N/A 12/29/2019   lysis of adhesions for SBO. by I. Tye  . bowel obstruction    . HYSTERECTOMY    . laryngeal cancer      Family History  Problem Relation Name Age of Onset  . Heart failure Mother    . Prostate cancer Father    . Kidney failure Brother      Social History   Socioeconomic History  . Marital status: Single  Occupational History  . Occupation: Retired   Tobacco Use  . Smoking status: Never    Passive exposure: Past  . Smokeless  tobacco: Never  Vaping Use  . Vaping status: Never Used  Substance and Sexual Activity  . Alcohol use: Never  . Drug use: Never  . Sexual activity: Defer   Social Drivers of Health   Financial Resource Strain: Low Risk  (11/08/2023)   Overall Financial Resource Strain (CARDIA)   . Difficulty of Paying Living Expenses: Not hard at all  Food Insecurity: No Food Insecurity (11/08/2023)   Hunger Vital Sign   . Worried About Programme researcher, broadcasting/film/video in the Last Year: Never true   . Ran Out of Food in the Last Year: Never true  Transportation Needs: No Transportation Needs (11/08/2023)   PRAPARE - Transportation   . Lack of Transportation (Medical): No   . Lack of Transportation (Non-Medical): No  Housing Stability: Low Risk  (11/08/2023)   Housing Stability Vital Sign   . Unable to Pay for Housing in the Last Year: No   . Number of Times Moved in the Last Year: 0   . Homeless in the Last Year: No    Health Maintenance  Topic Date Due  . Adult Tetanus (Td And Tdap)  10/07/2023  . Depression Screening  11/17/2023  . Medicare Subsequent AWV [id number]  11/18/2023  . Annual Physical/Well Child Check  11/18/2023  . Mammogram  01/05/2024  . COVID-19 Vaccine (1 - 2024-25 season) 05/16/2024 (Originally 02/28/2023)  . Influenza Vaccine (Season Ended) 02/28/2024  . Colorectal Cancer Screening  09/04/2024  . Potassium Level  10/31/2024  . Serum Calcium  10/31/2024  . DXA Bone Density Scan  12/27/2024  . RSV Immunization Pregnant or 60+ (1 - 1-dose 75+ series) 09/20/2027  . Lipid Panel  10/31/2028  . Hepatitis C Screen  Completed  . Hib Vaccines  Aged Out  . Hepatitis A Vaccines  Aged Out  . Meningococcal B Vaccine  Aged Out  . Meningococcal ACWY Vaccine  Aged Out  . HPV Vaccines  Aged Out  . Pneumococcal Vaccine: 50+  Discontinued  . Shingrix  Discontinued    Vitals:   11/08/23 1211  BP: (!) 146/86  Pulse: 79  SpO2: 99%  Weight: 50.8 kg (112 lb)  Height: 154.9 cm (5' 1)  PainSc: 0-No  pain   Body mass index is 21.16 kg/m.  Exam  General. Well appearing; NAD; VS reviewed     Eyes. Sclera and conjunctiva clear; Vision grossly intact; extraocular movements intact; dilated right pupil that is not reactive Neck. Supple. No swelling, masses, thyroid  normal size, no masses palpated.   Lungs. Respirations unlabored; clear to auscultation bilaterally Cardiovascular. Heart regular rate and rhythm without murmurs, gallops, or rubs Abdomen. Soft; non tender; non distended; normoactive bowel sounds; no masses or organomegaly Lymph Nodes. No significant cervical or supraclavicular lymphadenopathy noted Musculoskeletal. No deformities; no active joint inflammation Extremities.  no edema Skin. Normal color  and turgor Pulses. Dorsalis pedis palpable and symmetric bilaterally Neurologic. Alert and oriented x3; CN 2-12 grossly intact; no focal deficits  Assessment and Plan  1. Preventative health exam- Stable exam.  CV screening labs reviewed with patient.  Hypertension and hyperlipidemia at goal.  No diabetes.  CBC wnls.  Previous leukopenia resolved.  Up to date on mammogram (01/05/2023-repeat 1 year) and colonoscopy (09/05/2014-repeat 10 years).  No indication for Pap smear secondary to hysterectomy and age.  Vaccinations reviewed.  Counseled on nutrition modification and exercise.  2. Subsequent medicare wellness Providers Rendering Care 1. Dr. Alda Carpen (PCP) 2. [facility] 3. [facility]   Functional Assessment (1) Hearing: Demonstrates no difficulty in hearing during normal conversation (2) Risk of Falls: Patient denies any falls or near falls in the last year, Gait steady without assistance during walk from waiting area to exam room (3) Home Safety: Patient feels secure in their home, There are operational smoke alarms in multiple areas of the home (4) Activities of Daily Living: Independently manages personal grooming and household chores, including cooking,  cleaning and laundry. Manages Personal finances without assistance.   Depression Screening PHQ 2/9 last 3 flowsheet values     11/26/2021   10:19 AM 11/17/2022   12:20 PM 11/08/2023   12:49 PM  PHQ-2/9 Depression Screening   Little interest or pleasure in doing things  0 0  Feeling down, depressed, or hopeless  0 0  Patient Health Questionnaire-2 Score  0 0  (OBSOLETE) Little interest or pleasure in doing things 0    (OBSOLETE) Feeling down, depressed, or hopeless (or irritable for Teens only)? 0    (OBSOLETE) Total Prescreening Score 0    (OBSOLETE) Total Score = 0       Depression Severity and Treatment Recommendations:  0-4= None  5-9= Mild / Treatment: Support, educate to call if worse; return in one month  10-14= Moderate / Treatment: Support, watchful waiting; Antidepressant or Psychotherapy  15-19= Moderately severe / Treatment: Antidepressant OR Psychotherapy  >= 20 = Major depression, severe / Antidepressant AND Psychotherapy    Cognitive Impairment Patient denies episodes of loosing things, being forgetful. Seems oriented to person, place and time.  Responses appear appropriate and timely to this observer.   PREVENTION PLAN   Cardiovascular: FLP assessed Diabetes: A1c or FBG assessed Glaucoma: Neg per pt Hepatitis B (HBV) Vaccine:  Not Applicable Smoking Cessation:  Not Applicable   Other Personalized Health Advice   Encouraged patient to exercise 5 days a week, walking, water aerobics, gentle stretching recommended. Increase dietary intake of fresh fruits and vegetables, reduce red meat to twice a week.   End of Life Counseling Patient does not have a living will in place; POA - None; DNR/DNI   Medications and allergies reviewed and reconciled.  Preventative health and labs reviewed w/ pt.    Goals Addressed               This Visit's Progress   . * Maintain health/healthy lifestyle (pt-stated)   On track      Follow up: 6 months for reck; labs  prior  ALDA CARPEN, MD *Some images could not be shown.

## 2023-11-09 DIAGNOSIS — H2011 Chronic iridocyclitis, right eye: Secondary | ICD-10-CM | POA: Diagnosis not present

## 2023-11-09 DIAGNOSIS — H2512 Age-related nuclear cataract, left eye: Secondary | ICD-10-CM | POA: Diagnosis not present

## 2023-11-09 DIAGNOSIS — Z961 Presence of intraocular lens: Secondary | ICD-10-CM | POA: Diagnosis not present

## 2024-01-11 DIAGNOSIS — H2011 Chronic iridocyclitis, right eye: Secondary | ICD-10-CM | POA: Diagnosis not present

## 2024-01-11 DIAGNOSIS — H2512 Age-related nuclear cataract, left eye: Secondary | ICD-10-CM | POA: Diagnosis not present

## 2024-01-11 DIAGNOSIS — Z961 Presence of intraocular lens: Secondary | ICD-10-CM | POA: Diagnosis not present

## 2024-01-31 DIAGNOSIS — R399 Unspecified symptoms and signs involving the genitourinary system: Secondary | ICD-10-CM | POA: Diagnosis not present

## 2024-02-01 DIAGNOSIS — R300 Dysuria: Secondary | ICD-10-CM | POA: Diagnosis not present

## 2024-03-07 DIAGNOSIS — Z1231 Encounter for screening mammogram for malignant neoplasm of breast: Secondary | ICD-10-CM | POA: Diagnosis not present

## 2024-04-17 ENCOUNTER — Emergency Department

## 2024-04-17 ENCOUNTER — Inpatient Hospital Stay

## 2024-04-17 ENCOUNTER — Other Ambulatory Visit: Payer: Self-pay

## 2024-04-17 ENCOUNTER — Inpatient Hospital Stay
Admission: EM | Admit: 2024-04-17 | Discharge: 2024-04-26 | DRG: 336 | Disposition: A | Discharge status: Home / Self Care | Attending: Internal Medicine | Admitting: Internal Medicine

## 2024-04-17 DIAGNOSIS — K56609 Unspecified intestinal obstruction, unspecified as to partial versus complete obstruction: Secondary | ICD-10-CM | POA: Diagnosis not present

## 2024-04-17 DIAGNOSIS — E440 Moderate protein-calorie malnutrition: Secondary | ICD-10-CM | POA: Diagnosis present

## 2024-04-17 DIAGNOSIS — Z79899 Other long term (current) drug therapy: Secondary | ICD-10-CM | POA: Diagnosis not present

## 2024-04-17 DIAGNOSIS — K5652 Intestinal adhesions [bands] with complete obstruction: Principal | ICD-10-CM | POA: Diagnosis present

## 2024-04-17 DIAGNOSIS — Z9049 Acquired absence of other specified parts of digestive tract: Secondary | ICD-10-CM

## 2024-04-17 DIAGNOSIS — K567 Ileus, unspecified: Secondary | ICD-10-CM | POA: Diagnosis not present

## 2024-04-17 DIAGNOSIS — E876 Hypokalemia: Secondary | ICD-10-CM | POA: Diagnosis present

## 2024-04-17 DIAGNOSIS — I10 Essential (primary) hypertension: Secondary | ICD-10-CM | POA: Diagnosis not present

## 2024-04-17 DIAGNOSIS — Z6822 Body mass index (BMI) 22.0-22.9, adult: Secondary | ICD-10-CM | POA: Diagnosis not present

## 2024-04-17 DIAGNOSIS — N179 Acute kidney failure, unspecified: Secondary | ICD-10-CM | POA: Diagnosis present

## 2024-04-17 DIAGNOSIS — D696 Thrombocytopenia, unspecified: Secondary | ICD-10-CM

## 2024-04-17 DIAGNOSIS — I700 Atherosclerosis of aorta: Secondary | ICD-10-CM | POA: Diagnosis not present

## 2024-04-17 DIAGNOSIS — K56600 Partial intestinal obstruction, unspecified as to cause: Secondary | ICD-10-CM | POA: Diagnosis not present

## 2024-04-17 DIAGNOSIS — E785 Hyperlipidemia, unspecified: Secondary | ICD-10-CM | POA: Diagnosis present

## 2024-04-17 DIAGNOSIS — K565 Intestinal adhesions [bands], unspecified as to partial versus complete obstruction: Secondary | ICD-10-CM | POA: Diagnosis not present

## 2024-04-17 DIAGNOSIS — R112 Nausea with vomiting, unspecified: Secondary | ICD-10-CM | POA: Diagnosis not present

## 2024-04-17 DIAGNOSIS — R188 Other ascites: Secondary | ICD-10-CM | POA: Diagnosis not present

## 2024-04-17 DIAGNOSIS — K219 Gastro-esophageal reflux disease without esophagitis: Secondary | ICD-10-CM | POA: Diagnosis not present

## 2024-04-17 DIAGNOSIS — D72829 Elevated white blood cell count, unspecified: Secondary | ICD-10-CM | POA: Diagnosis present

## 2024-04-17 DIAGNOSIS — Z90711 Acquired absence of uterus with remaining cervical stump: Secondary | ICD-10-CM | POA: Diagnosis not present

## 2024-04-17 DIAGNOSIS — R1013 Epigastric pain: Secondary | ICD-10-CM | POA: Diagnosis not present

## 2024-04-17 DIAGNOSIS — Z833 Family history of diabetes mellitus: Secondary | ICD-10-CM | POA: Diagnosis not present

## 2024-04-17 DIAGNOSIS — K9189 Other postprocedural complications and disorders of digestive system: Secondary | ICD-10-CM | POA: Diagnosis not present

## 2024-04-17 DIAGNOSIS — Z880 Allergy status to penicillin: Secondary | ICD-10-CM | POA: Diagnosis not present

## 2024-04-17 DIAGNOSIS — J449 Chronic obstructive pulmonary disease, unspecified: Secondary | ICD-10-CM | POA: Diagnosis not present

## 2024-04-17 DIAGNOSIS — Z8719 Personal history of other diseases of the digestive system: Secondary | ICD-10-CM

## 2024-04-17 DIAGNOSIS — Z8419 Family history of other disorders of kidney and ureter: Secondary | ICD-10-CM

## 2024-04-17 DIAGNOSIS — K5989 Other specified functional intestinal disorders: Secondary | ICD-10-CM | POA: Diagnosis not present

## 2024-04-17 DIAGNOSIS — Z4682 Encounter for fitting and adjustment of non-vascular catheter: Secondary | ICD-10-CM | POA: Diagnosis not present

## 2024-04-17 LAB — COMPREHENSIVE METABOLIC PANEL WITH GFR
ALT: 15 U/L (ref 0–44)
ALT: 15 U/L (ref 0–44)
AST: 28 U/L (ref 15–41)
AST: 27 U/L (ref 15–41)
Albumin: 4.2 g/dL (ref 3.5–5.0)
Albumin: 3.8 g/dL (ref 3.5–5.0)
Alkaline Phosphatase: 58 U/L (ref 38–126)
Alkaline Phosphatase: 56 U/L (ref 38–126)
Anion gap: 12 (ref 5–15)
Anion gap: 8 (ref 5–15)
BUN: 15 mg/dL (ref 8–23)
BUN: 15 mg/dL (ref 8–23)
CO2: 27 mmol/L (ref 22–32)
CO2: 28 mmol/L (ref 22–32)
Calcium: 9.5 mg/dL (ref 8.9–10.3)
Calcium: 8.9 mg/dL (ref 8.9–10.3)
Chloride: 101 mmol/L (ref 98–111)
Chloride: 104 mmol/L (ref 98–111)
Creatinine, Ser: 1.01 mg/dL — ABNORMAL HIGH (ref 0.44–1.00)
Creatinine, Ser: 0.77 mg/dL (ref 0.44–1.00)
GFR, Estimated: 60 mL/min — ABNORMAL LOW (ref >60)
GFR, Estimated: >60 mL/min (ref >60)
Glucose, Bld: 188 mg/dL — ABNORMAL HIGH (ref 70–99)
Glucose, Bld: 125 mg/dL — ABNORMAL HIGH (ref 70–99)
Potassium: 3.5 mmol/L (ref 3.5–5.1)
Potassium: 3.6 mmol/L (ref 3.5–5.1)
Sodium: 140 mmol/L (ref 135–145)
Sodium: 140 mmol/L (ref 135–145)
Total Bilirubin: 0.8 mg/dL (ref 0.0–1.2)
Total Bilirubin: 0.6 mg/dL (ref 0.0–1.2)
Total Protein: 7.8 g/dL (ref 6.5–8.1)
Total Protein: 7.2 g/dL (ref 6.5–8.1)

## 2024-04-17 LAB — CBC
HCT: 40.8 % (ref 36.0–46.0)
HCT: 38.3 % (ref 36.0–46.0)
Hemoglobin: 13 g/dL (ref 12.0–15.0)
Hemoglobin: 12.2 g/dL (ref 12.0–15.0)
MCH: 22.7 pg — ABNORMAL LOW (ref 26.0–34.0)
MCH: 22.7 pg — ABNORMAL LOW (ref 26.0–34.0)
MCHC: 31.9 g/dL (ref 30.0–36.0)
MCHC: 31.9 g/dL (ref 30.0–36.0)
MCV: 71.2 fL — ABNORMAL LOW (ref 80.0–100.0)
MCV: 71.3 fL — ABNORMAL LOW (ref 80.0–100.0)
Platelets: 191 K/uL (ref 150–400)
Platelets: 196 K/uL (ref 150–400)
RBC: 5.73 MIL/uL — ABNORMAL HIGH (ref 3.87–5.11)
RBC: 5.37 MIL/uL — ABNORMAL HIGH (ref 3.87–5.11)
RDW: 16.1 % — ABNORMAL HIGH (ref 11.5–15.5)
RDW: 15.8 % — ABNORMAL HIGH (ref 11.5–15.5)
WBC: 12 K/uL — ABNORMAL HIGH (ref 4.0–10.5)
WBC: 11.3 K/uL — ABNORMAL HIGH (ref 4.0–10.5)
nRBC: 0 % (ref 0.0–0.2)
nRBC: 0 % (ref 0.0–0.2)

## 2024-04-17 LAB — URINALYSIS, ROUTINE W REFLEX MICROSCOPIC
Bilirubin Urine: NEGATIVE
Glucose, UA: NEGATIVE mg/dL
Hgb urine dipstick: NEGATIVE
Ketones, ur: NEGATIVE mg/dL
Leukocytes,Ua: NEGATIVE
Nitrite: NEGATIVE
Protein, ur: NEGATIVE mg/dL
Specific Gravity, Urine: >1.046 — ABNORMAL HIGH (ref 1.005–1.030)
pH: 6 (ref 5.0–8.0)

## 2024-04-17 LAB — LACTIC ACID, PLASMA
Lactic Acid, Venous: 1.8 mmol/L (ref 0.5–1.9)
Lactic Acid, Venous: 1.6 mmol/L (ref 0.5–1.9)

## 2024-04-17 LAB — LIPASE, BLOOD: Lipase: 41 U/L (ref 11–51)

## 2024-04-17 MED ORDER — FAMOTIDINE 20 MG PO TABS: 40.0000 mg | ORAL_TABLET | Freq: Every day | ORAL | Status: DC

## 2024-04-17 MED ORDER — ENOXAPARIN SODIUM 40 MG/0.4ML IJ SOSY
40.0000 mg | PREFILLED_SYRINGE | INTRAMUSCULAR | Status: DC
  Administered 2024-04-17 – 2024-04-26 (×7): 40 mg via SUBCUTANEOUS
  Filled 2024-04-17 (×10): qty 0.4

## 2024-04-17 MED ORDER — ACETAMINOPHEN 650 MG RE SUPP: 650.0000 mg | Freq: Four times a day (QID) | RECTAL | Status: DC | PRN

## 2024-04-17 MED ORDER — MORPHINE SULFATE (PF) 4 MG/ML IV SOLN
4.0000 mg | Freq: Once | INTRAVENOUS | Status: AC
  Administered 2024-04-17: 4 mg via INTRAVENOUS
  Filled 2024-04-17: qty 1

## 2024-04-17 MED ORDER — PREDNISOLONE ACETATE 1 % OP SUSP
1.0000 [drp] | Freq: Every day | OPHTHALMIC | Status: DC
  Administered 2024-04-17 – 2024-04-26 (×10): 1 [drp] via OPHTHALMIC
  Filled 2024-04-17 (×2): qty 1

## 2024-04-17 MED ORDER — LORAZEPAM 2 MG/ML IJ SOLN
0.2500 mg | Freq: Once | INTRAMUSCULAR | Status: AC
Start: 2024-04-17 — End: 2024-04-17
  Administered 2024-04-17: 0.25 mg via INTRAVENOUS
  Filled 2024-04-17: qty 1

## 2024-04-17 MED ORDER — ACETAMINOPHEN 325 MG PO TABS
650.0000 mg | ORAL_TABLET | Freq: Four times a day (QID) | ORAL | Status: DC | PRN
Start: 2024-04-17 — End: 2024-04-26
  Administered 2024-04-19: 650 mg via ORAL
  Filled 2024-04-17 (×4): qty 2

## 2024-04-17 MED ORDER — ORAL CARE MOUTH RINSE: 15.0000 mL | OROMUCOSAL | Status: DC | PRN

## 2024-04-17 MED ORDER — DEXTROSE 50 % IV SOLN: 1.0000 | INTRAVENOUS | Status: DC | PRN

## 2024-04-17 MED ORDER — IOHEXOL 300 MG/ML  SOLN
100.0000 mL | Freq: Once | INTRAMUSCULAR | Status: AC | PRN
  Administered 2024-04-17: 100 mL via INTRAVENOUS

## 2024-04-17 MED ORDER — ONDANSETRON HCL 4 MG/2ML IJ SOLN
4.0000 mg | Freq: Once | INTRAMUSCULAR | Status: AC
  Administered 2024-04-17: 4 mg via INTRAVENOUS
  Filled 2024-04-17: qty 2

## 2024-04-17 MED ORDER — HYDRALAZINE HCL 20 MG/ML IJ SOLN: 10.0000 mg | Freq: Four times a day (QID) | INTRAMUSCULAR | Status: DC | PRN

## 2024-04-17 MED ORDER — ONDANSETRON HCL 4 MG/2ML IJ SOLN
4.0000 mg | Freq: Four times a day (QID) | INTRAMUSCULAR | Status: DC | PRN
  Administered 2024-04-20: 4 mg via INTRAVENOUS

## 2024-04-17 MED ORDER — SODIUM CHLORIDE 0.9 % IV BOLUS
500.0000 mL | Freq: Once | INTRAVENOUS | Status: AC
  Administered 2024-04-17: 500 mL via INTRAVENOUS

## 2024-04-17 MED ORDER — SIMVASTATIN 20 MG PO TABS: 20.0000 mg | ORAL_TABLET | Freq: Every day | ORAL | Status: DC

## 2024-04-17 MED ORDER — MORPHINE SULFATE (PF) 2 MG/ML IV SOLN
2.0000 mg | INTRAVENOUS | Status: DC | PRN
  Administered 2024-04-17 – 2024-04-19 (×4): 2 mg via INTRAVENOUS
  Filled 2024-04-17 (×4): qty 1

## 2024-04-17 MED ORDER — LACTATED RINGERS IV SOLN: INTRAVENOUS | Status: AC

## 2024-04-17 MED ORDER — TRAZODONE HCL 50 MG PO TABS: 25.0000 mg | ORAL_TABLET | Freq: Every evening | ORAL | Status: DC | PRN

## 2024-04-17 MED ORDER — ATROPINE SULFATE 1 % OP SOLN
1.0000 [drp] | Freq: Every day | OPHTHALMIC | Status: DC
  Administered 2024-04-18 – 2024-04-25 (×7): 1 [drp] via OPHTHALMIC
  Filled 2024-04-17: qty 2

## 2024-04-17 MED ORDER — ONDANSETRON HCL 4 MG PO TABS: 4.0000 mg | ORAL_TABLET | Freq: Four times a day (QID) | ORAL | Status: DC | PRN

## 2024-04-17 MED ORDER — METOPROLOL SUCCINATE ER 50 MG PO TB24: 50.0000 mg | ORAL_TABLET | Freq: Every day | ORAL | Status: DC

## 2024-04-17 MED ORDER — PANTOPRAZOLE SODIUM 40 MG IV SOLR
40.0000 mg | INTRAVENOUS | Status: DC
  Administered 2024-04-17 – 2024-04-24 (×8): 40 mg via INTRAVENOUS
  Filled 2024-04-17 (×8): qty 10

## 2024-04-17 NOTE — Plan of Care (Signed)

## 2024-04-17 NOTE — Progress Notes (Incomplete)
 PROGRESS NOTE    Kim Potter   FMW:[id number] DOB: 05/05/1951  DOA: 04/17/2024 Date of Service: 04/17/24 which is hospital day 0  PCP: Alla Amis, MD    Hospital course / significant events:   Kim Potter is a 73 y.o. African-American female with medical history significant for hypertension, dyslipidemia and GERD, who presented to the emergency room with abdominal pain   HPI: acute onset abdominal pain that initially started in the epigastric area. She had a bowel movement in the morning. She has been having nonbloody nonbilious vomitus over the diarrhea or melena or bright red bleeding per rectum.   10/19: to ED. CT Abd/Pelv with contrast (+)High-grade small  bowel obstruction with a closed loop obstruction. Dr. Lane of surgery was contacted by hospitalist - plan NG tube placement.  10/20: ***     Consultants:  ***  Procedures/Surgeries: ***      ASSESSMENT & PLAN:   SBO (small bowel obstruction)  NPO NG to intermittent suction General surgery following  Pain management  IV fluids    Essential hypertension as needed IV hydralazine.   Dyslipidemia Statin can be resumed when she is able to take p.o.   GERD without esophagitis IV PPI therapy        No concerns based on BMI: Body mass index is 21.16 kg/m.[name] Significantly low or high BMI is associated with higher medical risk.  Underweight - under 18  overweight - 25 to 29 obese - 30 or more Class 1 obesity: BMI of 30.0 to 34 Class 2 obesity: BMI of 35.0 to 39 Class 3 obesity: BMI of 40.0 to 49 Super Morbid Obesity: BMI 50-59 Super-super Morbid Obesity: BMI 60+ Healthy nutrition and physical activity advised as adjunct to other disease management and risk reduction treatments    DVT prophylaxis: lovenox  IV fluids: LR continuous IV fluids  Nutrition: NPO Central lines / other devices: NG   Code Status: FULL CODE ACP documentation reviewed:  none on file in [facility]  TOC needs:  TBD Medical barriers to dispo: SBO as above. Expected medical readiness for discharge pending clinical course likely at least several days.              Subjective / Brief ROS:  Patient reports *** Denies CP/SOB.  Pain controlled.  Denies new weakness.  Tolerating diet ***.  Reports no concerns w/ urination/defecation.   Family Communication: ***    Objective Findings:  Vitals:   04/17/24 0054 04/17/24 0500 04/17/24 0534 04/17/24 0908  BP:  106/71 107/74 128/77  Pulse:  65 78 90  Resp:  (!) 28 17 18   Temp:  98.1 F (36.7 C) 98.5 F (36.9 C) 99.3 F (37.4 C)  TempSrc:  Oral    SpO2:  100% 100% 100%  Weight: 50.8 kg     Height: 5' 1 (1.549 m)       Intake/Output Summary (Last 24 hours) at 04/17/2024 0910 Last data filed at 04/17/2024 0300 Gross per 24 hour  Intake 500 ml  Output --  Net 500 ml   Filed Weights   04/17/24 0054  Weight: 50.8 kg    Examination:  Physical Exam       Scheduled Medications:   enoxaparin  (LOVENOX ) injection  40 mg Subcutaneous Q24H   pantoprazole (PROTONIX) IV  40 mg Intravenous Q24H    Continuous Infusions:  lactated ringers       PRN Medications:  acetaminophen  **OR** acetaminophen , dextrose, hydrALAZINE, morphine  injection, ondansetron  **OR** ondansetron  (ZOFRAN ) IV, mouth  rinse  Antimicrobials from admission:  Anti-infectives (From admission, onward)    None           Data Reviewed:  I have personally reviewed the following...  CBC: Recent Labs  Lab 04/17/24 0101 04/17/24 0514  WBC 12.0* 11.3*  HGB 13.0 12.2  HCT 40.8 38.3  MCV 71.2* 71.3*  PLT 191 196   Basic Metabolic Panel: Recent Labs  Lab 04/17/24 0101 04/17/24 0514  NA 140 140  K 3.5 3.6  CL 101 104  CO2 27 28  GLUCOSE 188* 125*  BUN 15 15  CREATININE 1.01* 0.77  CALCIUM 9.5 8.9   GFR: Estimated Creatinine Clearance: 48.7 mL/min (by C-G formula based on SCr of 0.77 mg/dL). Liver Function Tests: Recent Labs  Lab  04/17/24 0101 04/17/24 0514  AST 28 27  ALT 15 15  ALKPHOS 58 56  BILITOT 0.8 0.6  PROT 7.8 7.2  ALBUMIN 4.2 3.8   Recent Labs  Lab 04/17/24 0101  LIPASE 41   No results for input(s): AMMONIA in the last 168 hours. Coagulation Profile: No results for input(s): INR, PROTIME in the last 168 hours. Cardiac Enzymes: No results for input(s): CKTOTAL, CKMB, CKMBINDEX, TROPONINI in the last 168 hours. BNP (last 3 results) No results for input(s): PROBNP in the last 8760 hours. HbA1C: No results for input(s): HGBA1C in the last 72 hours. CBG: No results for input(s): GLUCAP in the last 168 hours. Lipid Profile: No results for input(s): CHOL, HDL, LDLCALC, TRIG, CHOLHDL, LDLDIRECT in the last 72 hours. Thyroid  Function Tests: No results for input(s): TSH, T4TOTAL, FREET4, T3FREE, THYROIDAB in the last 72 hours. Anemia Panel: No results for input(s): VITAMINB12, FOLATE, FERRITIN, TIBC, IRON, RETICCTPCT in the last 72 hours. Most Recent Urinalysis On File:     Component Value Date/Time   COLORURINE STRAW (A) 04/17/2024 0311   APPEARANCEUR CLEAR (A) 04/17/2024 0311   LABSPEC >1.046 (H) 04/17/2024 0311   PHURINE 6.0 04/17/2024 0311   GLUCOSEU NEGATIVE 04/17/2024 0311   HGBUR NEGATIVE 04/17/2024 0311   BILIRUBINUR NEGATIVE 04/17/2024 0311   KETONESUR NEGATIVE 04/17/2024 0311   PROTEINUR NEGATIVE 04/17/2024 0311   NITRITE NEGATIVE 04/17/2024 0311   LEUKOCYTESUR NEGATIVE 04/17/2024 0311   Sepsis Labs: @LABRCNTIP (procalcitonin:4,lacticidven:4) Microbiology: No results found for this or any previous visit (from the past 240 hours).    Radiology Studies last 3 days: DG Abd Portable 1V Result Date: 04/17/2024 CLINICAL DATA:  [id number].  For NGT placement. EXAM: PORTABLE ABDOMEN - 1 VIEW COMPARISON:  CT with IV contrast from earlier today . FINDINGS: 4:09 a.m. Current image excludes the pelvis. Small bowel dilatation up to 3.6 cm  continues to be seen. There is no supine evidence of free air. NGT has been inserted. The tube does enter the stomach but the side hole is at the EG junction and needs to be advanced further in 5-8 cm. There are artifacts from overlying clothing and telemetry wires. There is contrast within normal caliber bilateral renal collecting systems. IMPRESSION: 1. NGT does enter the stomach but the side hole is at the EG junction and needs to be advanced further in 5-8 cm. 2. Small bowel dilatation continues to be seen. Little if any change. Electronically Signed   By: Francis Quam M.D.   On: 04/17/2024 05:20   CT ABDOMEN PELVIS W CONTRAST Result Date: 04/17/2024 EXAM: CT ABDOMEN AND PELVIS WITH CONTRAST 04/17/2024 02:15:26 AM TECHNIQUE: CT of the abdomen and pelvis was performed with the administration of 100 mL  of iohexol  (OMNIPAQUE ) 300 MG/ML solution. Multiplanar reformatted images are provided for review. Automated exposure control, iterative reconstruction, and/or weight-based adjustment of the mA/kV was utilized to reduce the radiation dose to as low as reasonably achievable. COMPARISON: ct abd Pelvis 12/27/2019. CLINICAL HISTORY: Prior history of SBO s/p colectomy, now with similar pain, nausea, no bowel movements. Pt presented to ED with c/o abd pain, nausea, vomiting x 2 hours. States hx of GERD and SBO. States vomit has been undigested food. Pt also endorses diarrhea. A\T\Ox4. FINDINGS: LOWER CHEST: No acute abnormality. LIVER: The liver is unremarkable. GALLBLADDER AND BILE DUCTS: Gallbladder is unremarkable. No biliary ductal dilatation. SPLEEN: No acute abnormality. PANCREAS: No acute abnormality. ADRENAL GLANDS: No acute abnormality. KIDNEYS, URETERS AND BLADDER: No stones in the kidneys or ureters. No hydronephrosis. No perinephric or periureteral stranding. Urinary bladder is unremarkable. GI AND BOWEL: Stomach demonstrates no acute abnormality. Several loops of mid small bowel are dilated with fluid  with caliber measuring up to 3.6 cm in associated air-fluid levels. There is a transition point within the lower abdomen as well as a second transition point shortly after that with a short loop of bowel between these transition points also noted to be dilated with fluid (5.45). Large bowel is normal in caliber. No pneumatosis. PERITONEUM AND RETROPERITONEUM: No ascites. No free air. VASCULATURE: Aorta is normal in caliber. LYMPH NODES: No lymphadenopathy. REPRODUCTIVE ORGANS: No acute abnormality. BONES AND SOFT TISSUES: No acute osseous abnormality. No focal soft tissue abnormality. IMPRESSION: 1. High-grade small  bowel obstruction with a closed loop obstruction. 2. Nonobstructive bilateral nephrolithiasis. PRA to call report. Electronically signed by: Morgane Naveau MD 04/17/2024 02:45 AM EDT RP Workstation: [id number]       Time spent: PIERRETTE Laneta Blunt, DO [facility] 04/17/2024, 9:10 AM    Dictation software may have been used to generate the above note. Typos may occur and escape review in typed/dictated notes. Please contact Dr Blunt directly for clarity if needed.  Staff may message me via secure chat in Epic  but this may not receive an immediate response,  please page me for urgent matters!  If 7PM-7AM, please contact night coverage [email or web address]

## 2024-04-17 NOTE — Assessment & Plan Note (Signed)
-   The patient will be admitted to a surgical telemetry bed. - The patient will be NPO. - NG tube was placed. - Pain management will be provided. - General Surgery consult will be obtained. - Dr. Lane was notified about the patient. - Will follow two-view abdomen x-ray

## 2024-04-17 NOTE — Progress Notes (Addendum)
 NG tube started to intermittent low wall suction and Lactated Ringers  150ml/hr started per provider.  Plan of care continues.

## 2024-04-17 NOTE — ED Triage Notes (Signed)
 Pt presented to ED with c/o abd pain, nausea, vomiting x 2 hours. States hx of GERD and SBO. States vomit has been undigested food. Pt also endorses diarrhea. A&Ox4.

## 2024-04-17 NOTE — ED Provider Notes (Signed)
 [facility] Provider Note    Event Date/Time   First MD Initiated Contact with Patient 04/17/24 [phone]     (approximate)   History   Abdominal Pain   HPI  Kim Potter is a 73 y.o. female with prior history of small bowel obstruction status post colectomy in 2021 (Dr. Dena), hypertension, hyperlipidemia GERD who presents to the emergency department with several hours of epigastric which is converted to generalized abdominal pain nausea vomiting.  Patient denies any chest pain or shortness of breath.  Reports last bowel movement was this morning and was normal without any blood.  Emesis has been nonbloody and nonbilious.  She states symptoms are similar to when she presented for her small bowel obstruction in 2021      Physical Exam   Triage Vital Signs: ED Triage Vitals  Encounter Vitals Group     BP 04/17/24 0050 123/80     Girls Systolic BP Percentile --      Girls Diastolic BP Percentile --      Boys Systolic BP Percentile --      Boys Diastolic BP Percentile --      Pulse Rate 04/17/24 0050 75     Resp 04/17/24 0050 18     Temp 04/17/24 0050 (!) 97.3 F (36.3 C)     Temp Source 04/17/24 0050 Oral     SpO2 04/17/24 0050 100 %     Weight 04/17/24 0054 112 lb (50.8 kg)     Height 04/17/24 0054 5' 1 (1.549 m)     Head Circumference --      Peak Flow --      Pain Score 04/17/24 0053 10     Pain Loc --      Pain Education --      Exclude from Growth Chart --     Most recent vital signs: Vitals:   04/17/24 0500 04/17/24 0534  BP: 106/71 107/74  Pulse: 65 78  Resp: (!) 28 17  Temp: 98.1 F (36.7 C) 98.5 F (36.9 C)  SpO2: 100% 100%    Nursing Triage Note reviewed. Vital signs reviewed and patients oxygen saturation is normoxic  General: Patient is well nourished, well developed, awake and alert, Normocephalic and atraumatic Eyes: Normal inspection, extraocular muscles intact, no conjunctival pallor Ear, nose, throat: Normal  external exam Neck: Normal range of motion Respiratory: Patient is in no respiratory distress, lungs CTAB Cardiovascular: Patient is not tachycardic, RR GI: Abd soft, generalized tenderness to palpation, more so in the epigastrium.  No guarding no rebound no CVA tenderness Back: Normal inspection of the back with good strength and range of motion throughout all ext Extremities: pulses intact with good cap refills, no LE pitting edema or calf tenderness Neuro: The patient is alert and oriented to person, place, and time, appropriately conversive, with 5/5 bilat UE/LE strength, no gross motor or sensory defects noted. Coordination appears to be adequate. Skin: Warm, dry, and intact Psych: normal mood and affect, no SI or HI  ED Results / Procedures / Treatments   Labs (all labs ordered are listed, but only abnormal results are displayed) Labs Reviewed  COMPREHENSIVE METABOLIC PANEL WITH GFR - Abnormal; Notable for the following components:      Result Value   Glucose, Bld 188 (*)    Creatinine, Ser 1.01 (*)    GFR, Estimated 60 (*)    All other components within normal limits  CBC - Abnormal; Notable for the following  components:   WBC 12.0 (*)    RBC 5.73 (*)    MCV 71.2 (*)    MCH 22.7 (*)    RDW 16.1 (*)    All other components within normal limits  URINALYSIS, ROUTINE W REFLEX MICROSCOPIC - Abnormal; Notable for the following components:   Color, Urine STRAW (*)    APPearance CLEAR (*)    Specific Gravity, Urine >1.046 (*)    All other components within normal limits  COMPREHENSIVE METABOLIC PANEL WITH GFR - Abnormal; Notable for the following components:   Glucose, Bld 125 (*)    All other components within normal limits  CBC - Abnormal; Notable for the following components:   WBC 11.3 (*)    RBC 5.37 (*)    MCV 71.3 (*)    MCH 22.7 (*)    RDW 15.8 (*)    All other components within normal limits  LIPASE, BLOOD  LACTIC ACID, PLASMA  LACTIC ACID, PLASMA      EKG EKG and rhythm strip are interpreted by myself:   EKG: [Normal sinus rhythm] at heart rate of 89, normal QRS duration, QTc 481, nonspecific ST segments and T waves no ectopy EKG not consistent with Acute STEMI Rhythm strip: NSR in lead II   RADIOLOGY CT abd and pelvis with iv contrast: High-grade bowel obstruction per radiologist and I agree on my independent review interpretation KUB: Pending post NG tube insertion    PROCEDURES:  Critical Care performed: No  Procedures   MEDICATIONS ORDERED IN ED: Medications  metoprolol  succinate (TOPROL -XL) 24 hr tablet 50 mg (has no administration in time range)  simvastatin  (ZOCOR ) tablet 20 mg (has no administration in time range)  famotidine  (PEPCID ) tablet 40 mg (has no administration in time range)  enoxaparin  (LOVENOX ) injection 40 mg (has no administration in time range)  ondansetron  (ZOFRAN ) tablet 4 mg (has no administration in time range)    Or  ondansetron  (ZOFRAN ) injection 4 mg (has no administration in time range)  traZODone (DESYREL) tablet 25 mg (has no administration in time range)  acetaminophen  (TYLENOL ) tablet 650 mg (has no administration in time range)    Or  acetaminophen  (TYLENOL ) suppository 650 mg (has no administration in time range)  pantoprazole (PROTONIX) injection 40 mg (40 mg Intravenous Given 04/17/24 0558)  hydrALAZINE (APRESOLINE) injection 10 mg (has no administration in time range)  morphine  (PF) 2 MG/ML injection 2 mg (2 mg Intravenous Given 04/17/24 0559)  Oral care mouth rinse (has no administration in time range)  sodium chloride  0.9 % bolus 500 mL (0 mLs Intravenous Stopped 04/17/24 0300)  ondansetron  (ZOFRAN ) injection 4 mg (4 mg Intravenous Given 04/17/24 0128)  morphine  (PF) 4 MG/ML injection 4 mg (4 mg Intravenous Given 04/17/24 0129)  iohexol  (OMNIPAQUE ) 300 MG/ML solution 100 mL (100 mLs Intravenous Contrast Given 04/17/24 0205)  LORazepam (ATIVAN) injection 0.25 mg (0.25 mg  Intravenous Given 04/17/24 0332)     IMPRESSION / MDM / ASSESSMENT AND PLAN / ED COURSE                                Differential diagnosis includes, but is not limited to, small bowel obstruction, colitis, diverticulitis, URI, electrolyte derangement anemia   ED course: Patient presents and on presentation abdominal exam not consistent with any peritonitis.  Reassured that lactate is not elevated.  She is given a small bolus of IV fluids.  She does have a  small leukocytosis but no profound acute renal insufficiency or electrolyte derangements.  CT abdomen pelvis with IV contrast was consistent with a high-grade bowel obstruction.  Repeat abdominal exam unchanged.  Her case was discussed with on-call general surgeon and given patient's continued stability will attempt conservative measures (which patient is in agreement with as she would not likely want surgery again if at all possible).  Surgeon does recommend hospitalist admission and he will come and evaluate   Clinical Course as of 04/17/24 0724  Mon Apr 17, 2024  0118 WBC(!): 12.0 Slight elevation [HD]  0228 Lactic Acid, Venous: 1.8 [HD]  0250 CT abdomen pelvis consistent with high-grade bowel obstruction.  I have asked for a general surgery consult [HD]  0256 Patient's abdomen remains soft but tender generally.  I discussed the indications benefits with alternatives of an NG tube with the patient and she voiced understanding and stated that she would like to initially pray about it and hold off until I talk to the surgeon.  She was advised regarding her results thus far [HD]  0303 Case discussed with on-call surgeon Dr. Lane.  He does recommend inserting an NG tube and conservative measures at this time.  He does recommend hospitalist admission and he will come and evaluate [HD]  0309 Patient updated on plan and amenable to NG tube and conservative measures at this time [HD]  0405 Case discussed with hospitalist for [facility]  [HD]    Clinical Course User Index [HD] Nicholaus Rolland BRAVO, MD   -- Risk: 5 This patient has a high risk of morbidity due to further diagnostic testing or treatment. Rationale: This patient's evaluation and management involve a high risk of morbidity due to the potential severity of presenting symptoms, need for diagnostic testing, and/or initiation of treatment that may require close monitoring. The differential includes conditions with potential for significant deterioration or requiring escalation of care. Treatment decisions in the ED, including medication administration, procedural interventions, or disposition planning, reflect this level of risk. COPA: 5 The patient has the following acute or chronic illness/injury that poses a possible threat to life or bodily function: [X] : The patient has a potentially serious acute condition or an acute exacerbation of a chronic illness requiring urgent evaluation and management in the Emergency Department. The clinical presentation necessitates immediate consideration of life-threatening or function-threatening diagnoses, even if they are ultimately ruled out.   FINAL CLINICAL IMPRESSION(S) / ED DIAGNOSES   Final diagnoses:  SBO (small bowel obstruction) (HCC)  Nausea and vomiting, unspecified vomiting type     Rx / DC Orders   ED Discharge Orders     None        Note:  This document was prepared using Dragon voice recognition software and may include unintentional dictation errors.   Nicholaus Rolland BRAVO, MD 04/17/24 [phone]

## 2024-04-17 NOTE — Progress Notes (Signed)
 Patient refuses reinsertion of NG tube; provider notified via secure chat.

## 2024-04-17 NOTE — H&P (Signed)
 [place]   PATIENT NAME: Kim Potter    MR#:  [id number]  DATE OF BIRTH:  [birth year]  DATE OF ADMISSION:  04/17/2024  PRIMARY CARE PHYSICIAN: Alla Amis, MD   Patient is coming from: Home  REQUESTING/REFERRING PHYSICIAN: Nicholaus Crazier, MD  CHIEF COMPLAINT:   Chief Complaint  Patient presents with   Abdominal Pain    HISTORY OF PRESENT ILLNESS:  Kim Potter is a 73 y.o. African-American female with medical history significant for hypertension, dyslipidemia and GERD, who presented to the emergency room with acute onset of generalized abdominal pain that initially started in the epigastric area.  She had a bowel movement in the morning.  She has been having nonbloody nonbilious vomitus over the diarrhea or melena or bright red bleeding per rectum.  No fever or chills.  No chest pain or palpitations.  No cough or wheezing or dyspnea dysuria, leg or flank pain.  No other bleeding diathesis.  ED Course: When the patient came to the ER, temperature was 97.3 with otherwise normal vital signs.  Labs revealed borderline potassium of 3.5 and glucose of 188.  Lactic acid was 1.8 and later 1.6.  CBC showed leukocytosis of 12. EKG as reviewed by me : None Imaging: Flat abdomen x-ray revealed distended bowel. Abdominal and pelvic CT scan with contrast revealed the following: 1. High-grade small  bowel obstruction with a closed loop obstruction. 2. Nonobstructive bilateral nephrolithiasis.  The patient was given 500 mL IV normal saline bolus, 4 mg of IV Zofran , 4 mg of IV morphine  sulfate and 0.25 mg of IV Ativan.  Kim Potter was contacted about the patient and recommended NG tube placement.  She will be admitted to a  PAST MEDICAL HISTORY:   Past Medical History:  Diagnosis Date   GERD (gastroesophageal reflux disease)    Hyperlipidemia    Hypertension    Wears dentures    full upper and lower    PAST SURGICAL HISTORY:   Past Surgical History:  Procedure  Laterality Date   CATARACT EXTRACTION W/PHACO Right 04/22/2022   Procedure: CATARACT EXTRACTION PHACO AND INTRAOCULAR LENS PLACEMENT (IOC) RIGHT;  Surgeon: Mittie Gaskin, MD;  Location: [facility];  Service: Ophthalmology;  Laterality: Right;  7.94 01:03.7   LAPAROTOMY N/A 12/29/2019   Procedure: EXPLORATORY LAPAROTOMY;  Surgeon: Tye Millet, DO;  Location: [facility];  Service: General;  Laterality: N/A;   LYSIS OF ADHESION N/A 12/29/2019   Procedure: LYSIS OF ADHESION;  Surgeon: Tye Millet, DO;  Location: [facility];  Service: General;  Laterality: N/A;   PARTIAL HYSTERECTOMY      SOCIAL HISTORY:   Social History   Tobacco Use   Smoking status: Never   Smokeless tobacco: Never  Substance Use Topics   Alcohol use: No    FAMILY HISTORY:   Family History  Problem Relation Age of Onset   Diabetes Brother    Kidney disease Brother    Breast cancer Neg Hx     DRUG ALLERGIES:   Allergies  Allergen Reactions   Penicillins Itching and Rash    Has patient had a PCN reaction causing immediate rash, facial/tongue/throat swelling, SOB or lightheadedness with hypotension: No Has patient had a PCN reaction causing severe rash involving mucus membranes or skin necrosis: No Has patient had a PCN reaction that required hospitalization: No Has patient had a PCN reaction occurring within the last 10 years: No If all of the above answers are NO, then may proceed  with Cephalosporin use.     REVIEW OF SYSTEMS:   ROS As per history of present illness. All pertinent systems were reviewed above. Constitutional, HEENT, cardiovascular, respiratory, GI, GU, musculoskeletal, neuro, psychiatric, endocrine, integumentary and hematologic systems were reviewed and are otherwise negative/unremarkable except for positive findings mentioned above in the HPI.   MEDICATIONS AT HOME:   Prior to Admission medications   Medication Sig Start Date End Date Taking? Authorizing Provider   acetaminophen  (TYLENOL ) 325 MG tablet Take by mouth as needed. 04/18/19   [provider]  cephALEXin  (KEFLEX ) 500 MG capsule Take 1 capsule (500 mg total) by mouth 2 (two) times daily. 06/25/22   Ward, Josette SAILOR, DO  famotidine  (PEPCID ) 40 MG tablet Take 40 mg by mouth daily. 11/30/19 04/22/22  [provider]  fexofenadine (ALLEGRA) 180 MG tablet Take by mouth.    [provider]  fluconazole  (DIFLUCAN ) 150 MG tablet Take 1 tablet (150 mg total) by mouth daily. Take on 06/28/22 if still symptomatic. 06/25/22   Ward, Josette SAILOR, DO  fluticasone  (FLONASE ) 50 MCG/ACT nasal spray Place 1 spray into both nostrils daily.    [provider]  hydrochlorothiazide  (HYDRODIURIL ) 25 MG tablet Take 25 mg by mouth daily.    [provider]  metoprolol  succinate (TOPROL -XL) 50 MG 24 hr tablet Take 50 mg by mouth daily.  06/15/19 04/22/22  [provider]  simvastatin  (ZOCOR ) 20 MG tablet Take 20 mg by mouth at bedtime.     [provider]      VITAL SIGNS:  Blood pressure 106/71, pulse 65, temperature 98.1 F (36.7 C), temperature source Oral, resp. rate (!) 28, height 5' 1 (1.549 m), weight 50.8 kg, last menstrual period 01/08/1979, SpO2 100%.  PHYSICAL EXAMINATION:  Physical Exam  GENERAL:  73 y.o.-year-old African American female patient lying in the bed with no acute distress.  EYES: Pupils equal, round, reactive to light and accommodation. No scleral icterus. Extraocular muscles intact.  HEENT: Head atraumatic, normocephalic. Oropharynx and nasopharynx clear.  NECK:  Supple, no jugular venous distention. No thyroid  enlargement, no tenderness.  LUNGS: Normal breath sounds bilaterally, no wheezing, rales,rhonchi or crepitation. No use of accessory muscles of respiration.  CARDIOVASCULAR: Regular rate and rhythm, S1, S2 normal. No murmurs, rubs, or gallops.  ABDOMEN: Soft, nondistended, nontender. Bowel sounds present. No organomegaly or  mass.  EXTREMITIES: No pedal edema, cyanosis, or clubbing.  NEUROLOGIC: Cranial nerves II through XII are intact. Muscle strength 5/5 in all extremities. Sensation intact. Gait not checked.  PSYCHIATRIC: The patient is alert and oriented x 3.  Normal affect and good eye contact. SKIN: No obvious rash, lesion, or ulcer.   LABORATORY PANEL:   CBC Recent Labs  Lab 04/17/24 0101  WBC 12.0*  HGB 13.0  HCT 40.8  PLT 191   ------------------------------------------------------------------------------------------------------------------  Chemistries  Recent Labs  Lab 04/17/24 0101  NA 140  K 3.5  CL 101  CO2 27  GLUCOSE 188*  BUN 15  CREATININE 1.01*  CALCIUM 9.5  AST 28  ALT 15  ALKPHOS 58  BILITOT 0.8   ------------------------------------------------------------------------------------------------------------------  Cardiac Enzymes No results for input(s): TROPONINI in the last 168 hours. ------------------------------------------------------------------------------------------------------------------  RADIOLOGY:  CT ABDOMEN PELVIS W CONTRAST Result Date: 04/17/2024 EXAM: CT ABDOMEN AND PELVIS WITH CONTRAST 04/17/2024 02:15:26 AM TECHNIQUE: CT of the abdomen and pelvis was performed with the administration of 100 mL of iohexol  (OMNIPAQUE ) 300 MG/ML solution. Multiplanar reformatted images are provided for review. Automated exposure control, iterative  reconstruction, and/or weight-based adjustment of the mA/kV was utilized to reduce the radiation dose to as low as reasonably achievable. COMPARISON: ct abd Pelvis 12/27/2019. CLINICAL HISTORY: Prior history of SBO s/p colectomy, now with similar pain, nausea, no bowel movements. Pt presented to ED with c/o abd pain, nausea, vomiting x 2 hours. States hx of GERD and SBO. States vomit has been undigested food. Pt also endorses diarrhea. A\T\Ox4. FINDINGS: LOWER CHEST: No acute abnormality. LIVER: The liver is unremarkable.  GALLBLADDER AND BILE DUCTS: Gallbladder is unremarkable. No biliary ductal dilatation. SPLEEN: No acute abnormality. PANCREAS: No acute abnormality. ADRENAL GLANDS: No acute abnormality. KIDNEYS, URETERS AND BLADDER: No stones in the kidneys or ureters. No hydronephrosis. No perinephric or periureteral stranding. Urinary bladder is unremarkable. GI AND BOWEL: Stomach demonstrates no acute abnormality. Several loops of mid small bowel are dilated with fluid with caliber measuring up to 3.6 cm in associated air-fluid levels. There is a transition point within the lower abdomen as well as a second transition point shortly after that with a short loop of bowel between these transition points also noted to be dilated with fluid (5.45). Large bowel is normal in caliber. No pneumatosis. PERITONEUM AND RETROPERITONEUM: No ascites. No free air. VASCULATURE: Aorta is normal in caliber. LYMPH NODES: No lymphadenopathy. REPRODUCTIVE ORGANS: No acute abnormality. BONES AND SOFT TISSUES: No acute osseous abnormality. No focal soft tissue abnormality. IMPRESSION: 1. High-grade small  bowel obstruction with a closed loop obstruction. 2. Nonobstructive bilateral nephrolithiasis. PRA to call report. Electronically signed by: Morgane Naveau MD 04/17/2024 02:45 AM EDT RP Workstation: [id number]      IMPRESSION AND PLAN:  Assessment and Plan: * SBO (small bowel obstruction) (HCC) - The patient will be admitted to a surgical telemetry bed. - The patient will be NPO. - NG tube was placed. - Pain management will be provided. - General Surgery consult will be obtained. - Kim Potter was notified about the patient. - Will follow two-view abdomen x-ray  Essential hypertension - The patient will be placed on as needed IV hydralazine.  Dyslipidemia - Statin can be resumed when she is able to take p.o.  GERD without esophagitis - Will place her on IV PPI therapy   DVT prophylaxis: Lovenox .  Advanced Care Planning:   Code Status: full code.  Family Communication:  The plan of care was discussed in details with the patient (and family). I answered all questions. The patient agreed to proceed with the above mentioned plan. Further management will depend upon hospital course. Disposition Plan: Back to previous home environment Consults called: General Surgery All the records are reviewed and case discussed with ED provider.  Status is: Inpatient  At the time of the admission, it appears that the appropriate admission status for this patient is inpatient.  This is judged to be reasonable and necessary in order to provide the required intensity of service to ensure the patient's safety given the presenting symptoms, physical exam findings and initial radiographic and laboratory data in the context of comorbid conditions.  The patient requires inpatient status due to high intensity of service, high risk of further deterioration and high frequency of surveillance required.  I certify that at the time of admission, it is my clinical judgment that the patient will require inpatient hospital care extending more than 2 midnights.                            Dispo: The patient is  from: Home              Anticipated d/c is to: Home              Patient currently is not medically stable to d/c.              Difficult to place patient: No  Madison DELENA Peaches M.D on 04/17/2024 at 5:20 AM  [facility]   From 7 PM-7 AM, contact night-coverage [email or web address]  CC: Primary care physician; Alla Amis, MD

## 2024-04-17 NOTE — Progress Notes (Addendum)
  Brief Progress Note (See full H&P from earlier today)   Subjective: Pt seen ambulating in hall today with mobility specialist, no complaints    Objective: Physical Exam:  BP 128/77 (BP Location: Left Arm)   Pulse 90   Temp 99.3 F (37.4 C)   Resp 18   Ht 5' 1 (1.549 m)   Wt 50.8 kg   LMP 01/08/1979 (Approximate)   SpO2 100%   BMI 21.16 kg/m  Constitutional:  General Appearance: alert, NAD Respiratory: Normal respiratory effort Breath sounds normal Neurological: No cranial nerve deficit on limited exam Psychiatric: Normal judgment/insight Normal mood and affect   Assessment/Plan changes or updates compared to H&P: General surgery following NG not in place - needing to remove and replace, pt reluctant to replace, pending conversation w/ surgery team  SBO per general surgery  NPO, holding home meds, nothing being held cannot be held for now and/or adequately substituted IV No charge note

## 2024-04-17 NOTE — Progress Notes (Addendum)
 NG tube removed per verbal order per radiologist Florestine).  Patient tolerated well. Plan of care continues.

## 2024-04-17 NOTE — Assessment & Plan Note (Signed)
-   Statin can be resumed when she is able to take p.o.

## 2024-04-17 NOTE — Progress Notes (Signed)
 Mobility Specialist - Progress Note   04/17/24 1100  Mobility  Activity Ambulated independently  Level of Assistance Contact guard assist, steadying assist  Assistive Device Front wheel walker (IV Stand)  Distance Ambulated (ft) 180 ft  Range of Motion/Exercises All extremities  Activity Response Tolerated well  Mobility visit 1 Mobility  Mobility Specialist Start Time (ACUTE ONLY) 1034  Mobility Specialist Stop Time (ACUTE ONLY) 1055  Mobility Specialist Time Calculation (min) (ACUTE ONLY) 21 min   Pt was supine in bed upon entry. Pt agreed to mobility. Pt currently is connected to IV stand. Pt is able to get to the EOB independently with no AD or bed features. Pt is able to STS independently. Pt ambulated well. After activity pt returned to the bed with needs in reach. Techs entered the room for labs upon exit.  Clem Rodes Mobility Specialist 04/17/24, 11:06 AM

## 2024-04-17 NOTE — Assessment & Plan Note (Signed)
-   The patient will be placed on as needed IV hydralazine.

## 2024-04-17 NOTE — Hospital Course (Addendum)
 Hospital course / significant events:   Kim Potter is a 73 y.o. African-American female with medical history significant for hypertension, dyslipidemia and GERD, who presented to the emergency room with abdominal pain   HPI: acute onset abdominal pain that initially started in the epigastric area. She had a bowel movement in the morning. She has been having nonbloody nonbilious vomitus over the diarrhea or melena or bright red bleeding per rectum.   10/20: to ED. CT Abd/Pelv with contrast (+)High-grade small  bowel obstruction with a closed loop obstruction. Kim Potter of surgery was contacted by hospitalist - NG tube placement but this was malpositioned, pt reluctant for reinsertion - has declined and opt for conservative mgt reinsert If needed 10/21: passing flatus today, ambulating, per surgery sips and ice chips are ok for now and if doing well thru today can advance diet further tomorrow    Consultants:  General surgery   Procedures/Surgeries: none      ASSESSMENT & PLAN:   SBO (small bowel obstruction)  per surgery sips and ice chips are ok, and if doing well thru today can advance diet further tomorrow  General surgery following  Pain management  IV fluids    Essential hypertension as needed IV hydralazine. Restarted po amlodipine  and metoprolol  today  BP has been at goal, hold hydrochlorothiazide    Dyslipidemia Statin.   GERD without esophagitis IV PPI therapy   Allergies restart po meds fexofenadine, montelukast    No concerns based on BMI: Body mass index is 21.16 kg/m.Kim Potter Significantly low or high BMI is associated with higher medical risk.  Underweight - under 18  overweight - 25 to 29 obese - 30 or more Class 1 obesity: BMI of 30.0 to 34 Class 2 obesity: BMI of 35.0 to 39 Class 3 obesity: BMI of 40.0 to 49 Super Morbid Obesity: BMI 50-59 Super-super Morbid Obesity: BMI 60+ Healthy nutrition and physical activity advised as adjunct to other disease  management and risk reduction treatments    DVT prophylaxis: lovenox  IV fluids: LR w/ dextrose continuous IV fluids until able to take po  Nutrition: NPO w sips/chips Central lines / other devices: NG   Code Status: FULL CODE ACP documentation reviewed:  none on file in [facility]  TOC needs: TBD Medical barriers to dispo: SBO as above. Expected medical readiness for discharge pending clinical course likely at least 1-2 days.

## 2024-04-17 NOTE — Progress Notes (Signed)
 NG tube suction stopped and clamped per consulting general surgery provider.

## 2024-04-17 NOTE — Consult Note (Signed)
 [facility] SURGICAL CONSULTATION NOTE (initial) - cpt: [id number]   HISTORY OF PRESENT ILLNESS (HPI):  73 y.o. female presented to [facility] ED yesterday for abdominal pain. Patient reports around a 12-24 hours history of vague generalized abdominal discomfort with nausea and emesis. She reports bowel function yesterday which was normal but none since. She is without fever, chills, cough, SOB, CP, urinary changes. She does have a history of SBO in 2021 which required laparotomy (Dr Tye). Previous abdominal surgeries prior to this positive for hysterectomy. Work up in the ED revealed mild leukocytosis to 12.0K, Hgb to 13.0, mild AKI with sCr - 1.01 (now 0.77 this AM), venous lactate normal x2. She did have CT Abdomen/Pelvis concerning for SBO. She had NGT placed. Output not documented.   Surgery is consulted by hospitalist physician Dr. Madison Peaches, MD in this context for evaluation and management of SBO.  PAST MEDICAL HISTORY (PMH):  Past Medical History:  Diagnosis Date   GERD (gastroesophageal reflux disease)    Hyperlipidemia    Hypertension    Wears dentures    full upper and lower     PAST SURGICAL HISTORY (PSH):  Past Surgical History:  Procedure Laterality Date   CATARACT EXTRACTION W/PHACO Right 04/22/2022   Procedure: CATARACT EXTRACTION PHACO AND INTRAOCULAR LENS PLACEMENT (IOC) RIGHT;  Surgeon: Mittie Gaskin, MD;  Location: [facility];  Service: Ophthalmology;  Laterality: Right;  7.94 01:03.7   LAPAROTOMY N/A 12/29/2019   Procedure: EXPLORATORY LAPAROTOMY;  Surgeon: Tye Millet, DO;  Location: [facility];  Service: General;  Laterality: N/A;   LYSIS OF ADHESION N/A 12/29/2019   Procedure: LYSIS OF ADHESION;  Surgeon: Tye Millet, DO;  Location: [facility];  Service: General;  Laterality: N/A;   PARTIAL HYSTERECTOMY       MEDICATIONS:  Prior to Admission medications   Medication Sig Start Date End Date Taking? Authorizing Provider  acetaminophen   (TYLENOL ) 325 MG tablet Take by mouth as needed. 04/18/19   [provider]  cephALEXin  (KEFLEX ) 500 MG capsule Take 1 capsule (500 mg total) by mouth 2 (two) times daily. 06/25/22   Ward, Josette SAILOR, DO  famotidine  (PEPCID ) 40 MG tablet Take 40 mg by mouth daily. 11/30/19 04/22/22  [provider]  fexofenadine (ALLEGRA) 180 MG tablet Take by mouth.    [provider]  fluconazole  (DIFLUCAN ) 150 MG tablet Take 1 tablet (150 mg total) by mouth daily. Take on 06/28/22 if still symptomatic. 06/25/22   Ward, Josette SAILOR, DO  fluticasone  (FLONASE ) 50 MCG/ACT nasal spray Place 1 spray into both nostrils daily.    [provider]  hydrochlorothiazide  (HYDRODIURIL ) 25 MG tablet Take 25 mg by mouth daily.    [provider]  metoprolol  succinate (TOPROL -XL) 50 MG 24 hr tablet Take 50 mg by mouth daily.  06/15/19 04/22/22  [provider]  simvastatin  (ZOCOR ) 20 MG tablet Take 20 mg by mouth at bedtime.     [provider]     ALLERGIES:  Allergies  Allergen Reactions   Penicillins Itching and Rash    Has patient had a PCN reaction causing immediate rash, facial/tongue/throat swelling, SOB or lightheadedness with hypotension: No Has patient had a PCN reaction causing severe rash involving mucus membranes or skin necrosis: No Has patient had a PCN reaction that required hospitalization: No Has patient had a PCN reaction occurring within the last 10 years: No If all of the above answers are NO, then may proceed with Cephalosporin use.  SOCIAL HISTORY:  Social History   Socioeconomic History   Marital status: Divorced    Spouse name: Not on file   Number of children: Not on file   Years of education: Not on file   Highest education level: Not on file  Occupational History   Not on file  Tobacco Use   Smoking status: Never   Smokeless tobacco: Never  Vaping Use   Vaping status: Never Used  Substance and Sexual Activity    Alcohol use: No   Drug use: No   Sexual activity: Not on file  Other Topics Concern   Not on file  Social History Narrative   Not on file   Social Drivers of Health   Financial Resource Strain: Low Risk  (11/08/2023)   Received from [facility]   Overall Financial Resource Strain (CARDIA)    Difficulty of Paying Living Expenses: Not hard at all  Food Insecurity: No Food Insecurity (04/17/2024)   Hunger Vital Sign    Worried About Running Out of Food in the Last Year: Never true    Ran Out of Food in the Last Year: Never true  Transportation Needs: No Transportation Needs (04/17/2024)   PRAPARE - Administrator, Civil Service (Medical): No    Lack of Transportation (Non-Medical): No  Physical Activity: Not on file  Stress: Not on file  Social Connections: Not on file  Intimate Partner Violence: Not At Risk (04/17/2024)   Humiliation, Afraid, Rape, and Kick questionnaire    Fear of Current or Ex-Partner: No    Emotionally Abused: No    Physically Abused: No    Sexually Abused: No     FAMILY HISTORY:  Family History  Problem Relation Age of Onset   Diabetes Brother    Kidney disease Brother    Breast cancer Neg Hx       REVIEW OF SYSTEMS:  Review of Systems  Constitutional:  Negative for chills and fever.  Respiratory:  Negative for cough and shortness of breath.   Cardiovascular:  Negative for chest pain and palpitations.  Gastrointestinal:  Positive for abdominal pain, nausea and vomiting. Negative for blood in stool and diarrhea.  Genitourinary:  Negative for dysuria and urgency.  All other systems reviewed and are negative.   VITAL SIGNS:  Temp:  [97.3 F (36.3 C)-99.3 F (37.4 C)] 99.3 F (37.4 C) (10/20 0908) Pulse Rate:  [65-90] 90 (10/20 0908) Resp:  [17-28] 18 (10/20 0908) BP: (106-128)/(71-80) 128/77 (10/20 0908) SpO2:  [100 %] 100 % (10/20 0908) Weight:  [50.8 kg] 50.8 kg (10/20 0054)     Height: 5' 1 (154.9 cm) Weight:  50.8 kg BMI (Calculated): 21.17   INTAKE/OUTPUT:  10/19 0701 - 10/20 0700 In: 500 [IV Piggyback:500] Out: -   PHYSICAL EXAM:  Physical Exam Vitals and nursing note reviewed. Exam conducted with a chaperone present.  Constitutional:      General: She is not in acute distress.    Appearance: She is well-developed and normal weight. She is not ill-appearing.  HENT:     Head: Normocephalic and atraumatic.     Comments: NGT in place; clamped this AM Eyes:     General: No scleral icterus.    Extraocular Movements: Extraocular movements intact.  Cardiovascular:     Rate and Rhythm: Normal rate.     Heart sounds: Normal heart sounds. No murmur heard. Pulmonary:     Effort: Pulmonary effort is normal. No respiratory distress.  Abdominal:  General: Abdomen is flat. A surgical scar is present. There is distension.     Palpations: Abdomen is soft.     Tenderness: There is no abdominal tenderness. There is no guarding or rebound.     Comments: Abdomen is soft, she does not appear overtly tender, she is mildly distended/tympanic, no rebound/guarding. She is certainly not peritonitic   Genitourinary:    Comments: Deferred Neurological:     Mental Status: She is alert.      Labs:     Latest Ref Rng & Units 04/17/2024    5:14 AM 04/17/2024    1:01 AM 06/24/2022    4:07 PM  CBC  WBC 4.0 - 10.5 K/uL 11.3  12.0  5.2   Hemoglobin 12.0 - 15.0 g/dL 87.7  86.9  86.9   Hematocrit 36.0 - 46.0 % 38.3  40.8  39.8   Platelets 150 - 400 K/uL 196  191  200       Latest Ref Rng & Units 04/17/2024    5:14 AM 04/17/2024    1:01 AM 06/24/2022    4:07 PM  CMP  Glucose 70 - 99 mg/dL 874  811  896   BUN 8 - 23 mg/dL 15  15  17    Creatinine 0.44 - 1.00 mg/dL 9.22  8.98  9.09   Sodium 135 - 145 mmol/L 140  140  139   Potassium 3.5 - 5.1 mmol/L 3.6  3.5  3.1   Chloride 98 - 111 mmol/L 104  101  99   CO2 22 - 32 mmol/L 28  27  30    Calcium 8.9 - 10.3 mg/dL 8.9  9.5  9.8   Total Protein 6.5 -  8.1 g/dL 7.2  7.8    Total Bilirubin 0.0 - 1.2 mg/dL 0.6  0.8    Alkaline Phos 38 - 126 U/L 56  58    AST 15 - 41 U/L 27  28    ALT 0 - 44 U/L 15  15      Imaging studies:   CT Abdomen/Pelvis (04/17/2024) personally reviewed with dilated loops of small bowel, and radiologist report reviewed below:  IMPRESSION: 1. High-grade small  bowel obstruction with a closed loop obstruction. 2. Nonobstructive bilateral nephrolithiasis. PRA to call report.   Assessment/Plan: (ICD-10's: K40.609) 73 y.o. 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 She understands if she fails to resolve, or deteriorates, we will need to consider this. She wishes for this to be last resort    - Monitor abdominal examination; on-gong bowel function  - Pain control prn; antiemetics prn  - Mobilize  - Further management per primary service; we will follow   All of the above findings and recommendations were discussed with the patient, and all of patient's questions were answered to her expressed satisfaction.  Thank you for the opportunity to participate in this patient's care.   -- Arthea Platt, PA-C [facility] 04/17/2024, 10:01 AM M-F: 7am - 4pm

## 2024-04-17 NOTE — Assessment & Plan Note (Signed)
-   Will place her on IV PPI therapy

## 2024-04-18 ENCOUNTER — Inpatient Hospital Stay

## 2024-04-18 DIAGNOSIS — K56609 Unspecified intestinal obstruction, unspecified as to partial versus complete obstruction: Secondary | ICD-10-CM | POA: Diagnosis not present

## 2024-04-18 LAB — BASIC METABOLIC PANEL WITH GFR
Anion gap: 7 (ref 5–15)
BUN: 15 mg/dL (ref 8–23)
CO2: 28 mmol/L (ref 22–32)
Calcium: 8.4 mg/dL — ABNORMAL LOW (ref 8.9–10.3)
Chloride: 106 mmol/L (ref 98–111)
Creatinine, Ser: 0.77 mg/dL (ref 0.44–1.00)
GFR, Estimated: >60 mL/min (ref >60)
Glucose, Bld: 91 mg/dL (ref 70–99)
Potassium: 3.6 mmol/L (ref 3.5–5.1)
Sodium: 141 mmol/L (ref 135–145)

## 2024-04-18 LAB — CBC
HCT: 33.6 % — ABNORMAL LOW (ref 36.0–46.0)
Hemoglobin: 11 g/dL — ABNORMAL LOW (ref 12.0–15.0)
MCH: 23 pg — ABNORMAL LOW (ref 26.0–34.0)
MCHC: 32.7 g/dL (ref 30.0–36.0)
MCV: 70.3 fL — ABNORMAL LOW (ref 80.0–100.0)
Platelets: 153 K/uL (ref 150–400)
RBC: 4.78 MIL/uL (ref 3.87–5.11)
RDW: 15.5 % (ref 11.5–15.5)
WBC: 5.4 K/uL (ref 4.0–10.5)
nRBC: 0 % (ref 0.0–0.2)

## 2024-04-18 LAB — GLUCOSE, CAPILLARY: Glucose-Capillary: 93 mg/dL (ref 70–99)

## 2024-04-18 MED ORDER — SODIUM CHLORIDE 0.9 % IV SOLN: INTRAVENOUS | Status: DC

## 2024-04-18 MED ORDER — AMLODIPINE BESYLATE 5 MG PO TABS
5.0000 mg | ORAL_TABLET | Freq: Every day | ORAL | Status: DC
  Administered 2024-04-18 – 2024-04-26 (×9): 5 mg via ORAL
  Filled 2024-04-18 (×9): qty 1

## 2024-04-18 MED ORDER — MONTELUKAST SODIUM 10 MG PO TABS
10.0000 mg | ORAL_TABLET | Freq: Every day | ORAL | Status: DC
  Administered 2024-04-18 – 2024-04-25 (×8): 10 mg via ORAL
  Filled 2024-04-18 (×8): qty 1

## 2024-04-18 MED ORDER — DEXTROSE IN LACTATED RINGERS 5 % IV SOLN
INTRAVENOUS | Status: DC
  Filled 2024-04-18: qty 1000

## 2024-04-18 MED ORDER — METOPROLOL SUCCINATE ER 50 MG PO TB24
50.0000 mg | ORAL_TABLET | Freq: Every day | ORAL | Status: DC
  Administered 2024-04-18 – 2024-04-26 (×9): 50 mg via ORAL
  Filled 2024-04-18 (×9): qty 1

## 2024-04-18 MED ORDER — LORATADINE 10 MG PO TABS
10.0000 mg | ORAL_TABLET | Freq: Every day | ORAL | Status: DC
Start: 2024-04-18 — End: 2024-04-19
  Filled 2024-04-18 (×2): qty 1

## 2024-04-18 MED ORDER — SIMVASTATIN 20 MG PO TABS
20.0000 mg | ORAL_TABLET | Freq: Every day | ORAL | Status: DC
  Administered 2024-04-18 – 2024-04-25 (×8): 20 mg via ORAL
  Filled 2024-04-18 (×8): qty 1

## 2024-04-18 NOTE — Progress Notes (Signed)
 PROGRESS NOTE    Kim Potter   FMW:[id number] DOB: 11-23-1950  DOA: 04/17/2024 Date of Service: 04/18/24 which is hospital day 1  PCP: Alla Amis, MD    Hospital course / significant events:   Kim Potter is a 73 y.o. African-American female with medical history significant for hypertension, dyslipidemia and GERD, who presented to the emergency room with abdominal pain   HPI: acute onset abdominal pain that initially started in the epigastric area. She had a bowel movement in the morning. She has been having nonbloody nonbilious vomitus over the diarrhea or melena or bright red bleeding per rectum.   10/20: to ED. CT Abd/Pelv with contrast (+)High-grade small  bowel obstruction with a closed loop obstruction. Dr. Lane of surgery was contacted by hospitalist - NG tube placement but this was malpositioned, pt reluctant for reinsertion - has declined and opt for conservative mgt reinsert If needed 10/21: passing flatus today, ambulating, per surgery sips and ice chips are ok for now and if doing well thru today can advance diet further tomorrow    Consultants:  General surgery   Procedures/Surgeries: none      ASSESSMENT & PLAN:   SBO (small bowel obstruction)  per surgery sips and ice chips are ok, and if doing well thru today can advance diet further tomorrow  General surgery following  Pain management  IV fluids    Essential hypertension as needed IV hydralazine. Restarted po amlodipine  and metoprolol  today  BP has been at goal, hold hydrochlorothiazide    Dyslipidemia Statin.   GERD without esophagitis IV PPI therapy   Allergies restart po meds fexofenadine, montelukast    No concerns based on BMI: Body mass index is 21.16 kg/m.[name] Significantly low or high BMI is associated with higher medical risk.  Underweight - under 18  overweight - 25 to 29 obese - 30 or more Class 1 obesity: BMI of 30.0 to 34 Class 2 obesity: BMI of 35.0 to 39 Class 3  obesity: BMI of 40.0 to 49 Super Morbid Obesity: BMI 50-59 Super-super Morbid Obesity: BMI 60+ Healthy nutrition and physical activity advised as adjunct to other disease management and risk reduction treatments    DVT prophylaxis: lovenox  IV fluids: LR w/ dextrose continuous IV fluids until able to take po  Nutrition: NPO w sips/chips Central lines / other devices: NG   Code Status: FULL CODE ACP documentation reviewed:  none on file in [facility]  TOC needs: TBD Medical barriers to dispo: SBO as above. Expected medical readiness for discharge pending clinical course likely at least 1-2 days.              Subjective / Brief ROS:  Patient reports doing better today, passing flatus and very excited about this Denies CP/SOB.  Pain controlled.  Denies new weakness. .  Reports no concerns w/ urination No stool yet   Family Communication: none at this time     Objective Findings:  Vitals:   04/17/24 1917 04/18/24 0310 04/18/24 0700 04/18/24 1644  BP: 124/73 138/79 132/85 123/79  Pulse: 81 86 99 93  Resp: 18 15 17 18   Temp: 98.6 F (37 C) 98.7 F (37.1 C) 97.7 F (36.5 C) 98 F (36.7 C)  TempSrc:   Oral   SpO2: 100% 100% 99% 100%  Weight:      Height:       No intake or output data in the 24 hours ending 04/18/24 1654 Filed Weights   04/17/24 0054  Weight: 50.8  kg    Examination:  Physical Exam Constitutional:      General: She is not in acute distress. Cardiovascular:     Rate and Rhythm: Normal rate and regular rhythm.  Abdominal:     General: Bowel sounds are increased.     Palpations: Abdomen is soft.     Tenderness: There is no abdominal tenderness.  Neurological:     Mental Status: She is alert and oriented to person, place, and time.  Psychiatric:        Mood and Affect: Mood normal.        Behavior: Behavior normal.          Scheduled Medications:   amLODipine   5 mg Oral Daily   atropine  1 drop Right Eye QHS   enoxaparin   (LOVENOX ) injection  40 mg Subcutaneous Q24H   loratadine   10 mg Oral Daily   metoprolol  succinate  50 mg Oral Daily   montelukast  10 mg Oral QHS   pantoprazole (PROTONIX) IV  40 mg Intravenous Q24H   prednisoLONE acetate  1 drop Right Eye Daily   simvastatin   20 mg Oral QHS    Continuous Infusions:  sodium chloride  40 mL/hr at 04/18/24 0943   dextrose 5% lactated ringers       PRN Medications:  acetaminophen  **OR** acetaminophen , dextrose, hydrALAZINE, morphine  injection, ondansetron  **OR** ondansetron  (ZOFRAN ) IV, mouth rinse  Antimicrobials from admission:  Anti-infectives (From admission, onward)    None           Data Reviewed:  I have personally reviewed the following...  CBC: Recent Labs  Lab 04/17/24 0101 04/17/24 0514 04/18/24 0421  WBC 12.0* 11.3* 5.4  HGB 13.0 12.2 11.0*  HCT 40.8 38.3 33.6*  MCV 71.2* 71.3* 70.3*  PLT 191 196 153   Basic Metabolic Panel: Recent Labs  Lab 04/17/24 0101 04/17/24 0514 04/18/24 0421  NA 140 140 141  K 3.5 3.6 3.6  CL 101 104 106  CO2 27 28 28   GLUCOSE 188* 125* 91  BUN 15 15 15   CREATININE 1.01* 0.77 0.77  CALCIUM 9.5 8.9 8.4*   GFR: Estimated Creatinine Clearance: 48.7 mL/min (by C-G formula based on SCr of 0.77 mg/dL). Liver Function Tests: Recent Labs  Lab 04/17/24 0101 04/17/24 0514  AST 28 27  ALT 15 15  ALKPHOS 58 56  BILITOT 0.8 0.6  PROT 7.8 7.2  ALBUMIN 4.2 3.8   Recent Labs  Lab 04/17/24 0101  LIPASE 41   No results for input(s): AMMONIA in the last 168 hours. Coagulation Profile: No results for input(s): INR, PROTIME in the last 168 hours. Cardiac Enzymes: No results for input(s): CKTOTAL, CKMB, CKMBINDEX, TROPONINI in the last 168 hours. BNP (last 3 results) No results for input(s): PROBNP in the last 8760 hours. HbA1C: No results for input(s): HGBA1C in the last 72 hours. CBG: No results for input(s): GLUCAP in the last 168 hours. Lipid Profile: No  results for input(s): CHOL, HDL, LDLCALC, TRIG, CHOLHDL, LDLDIRECT in the last 72 hours. Thyroid  Function Tests: No results for input(s): TSH, T4TOTAL, FREET4, T3FREE, THYROIDAB in the last 72 hours. Anemia Panel: No results for input(s): VITAMINB12, FOLATE, FERRITIN, TIBC, IRON, RETICCTPCT in the last 72 hours. Most Recent Urinalysis On File:     Component Value Date/Time   COLORURINE STRAW (A) 04/17/2024 0311   APPEARANCEUR CLEAR (A) 04/17/2024 0311   LABSPEC >1.046 (H) 04/17/2024 0311   PHURINE 6.0 04/17/2024 0311   GLUCOSEU NEGATIVE 04/17/2024 9688  HGBUR NEGATIVE 04/17/2024 0311   BILIRUBINUR NEGATIVE 04/17/2024 0311   KETONESUR NEGATIVE 04/17/2024 0311   PROTEINUR NEGATIVE 04/17/2024 0311   NITRITE NEGATIVE 04/17/2024 0311   LEUKOCYTESUR NEGATIVE 04/17/2024 0311   Sepsis Labs: @LABRCNTIP (procalcitonin:4,lacticidven:4) Microbiology: No results found for this or any previous visit (from the past 240 hours).    Radiology Studies last 3 days: DG Abd 1 View Result Date: 04/18/2024 EXAM: 1 VIEW XRAY OF THE ABDOMEN 04/18/2024 07:09:53 AM COMPARISON: 04/17/2024 CLINICAL HISTORY: SBO (small bowel obstruction) (HCC) [id number]. sbo. FINDINGS: BOWEL: Persistent dilatation of small bowel loops consistent with small bowel obstruction. Small bowel dilatation stable compared to prior. SOFT TISSUES: No opaque urinary calculi. BONES: No acute osseous abnormality. IMPRESSION: 1. Persistent dilatation of small bowel loops consistent with small bowel obstruction. Not significantly changed from prior exam. Electronically signed by: Waddell Calk MD 04/18/2024 04:20 PM EDT RP Workstation: [id number]   DG Abd 2 Views Addendum Date: 04/17/2024 ADDENDUM REPORT: 04/17/2024 15:26 ADDENDUM: Critical Value/emergent results were called by telephone at the time of interpretation on 04/17/2024 at 3:23 pm to [facility], the patient's nurse, with instructions to pull or reposition  the tube with no contrast or other administration through the tube until good position verified. She verbally acknowledged these results. Electronically Signed   By: Elspeth Bathe M.D.   On: 04/17/2024 15:26   Result Date: 04/17/2024 CLINICAL DATA:  Follow-up small bowel obstruction, 8 hours following contrast administration through the patient's nasogastric tube. The technologist reports that patient's nasogastric tube was still taped to the patient's nose at the time of current image acquisition. EXAM: DG ABDOMEN 2V COMPARISON:  Earlier today. FINDINGS: The nasogastric tube is no longer seen in the included chest or abdomen. Interval small amount of contrast at the medial left lung base in the left lower lobe. The remainder of the included lungs are clear. Borderline enlarged cardiac silhouette. The included upper abdomen demonstrates mildly progressive proximal small bowel dilatation measuring up to 4.6 cm in diameter without correction for magnification. Lower lumbar spine degenerative changes and mild scoliosis. IMPRESSION: 1. The nasogastric tube is no longer seen in the included chest or abdomen. 2. Interval small amount of aspirated contrast in the left lower lobe. 3. Mildly progressive proximal small bowel obstruction. Note: Critical value is being called to the patient's provider. Once this is accomplished, a reported addendum will be issued. Electronically Signed: By: Elspeth Bathe M.D. On: 04/17/2024 15:12   DG Abd Portable 1V Result Date: 04/17/2024 CLINICAL DATA:  [id number].  For NGT placement. EXAM: PORTABLE ABDOMEN - 1 VIEW COMPARISON:  CT with IV contrast from earlier today . FINDINGS: 4:09 a.m. Current image excludes the pelvis. Small bowel dilatation up to 3.6 cm continues to be seen. There is no supine evidence of free air. NGT has been inserted. The tube does enter the stomach but the side hole is at the EG junction and needs to be advanced further in 5-8 cm. There are artifacts from overlying  clothing and telemetry wires. There is contrast within normal caliber bilateral renal collecting systems. IMPRESSION: 1. NGT does enter the stomach but the side hole is at the EG junction and needs to be advanced further in 5-8 cm. 2. Small bowel dilatation continues to be seen. Little if any change. Electronically Signed   By: Francis Quam M.D.   On: 04/17/2024 05:20   CT ABDOMEN PELVIS W CONTRAST Result Date: 04/17/2024 EXAM: CT ABDOMEN AND PELVIS WITH CONTRAST 04/17/2024 02:15:26 AM TECHNIQUE: CT of the  abdomen and pelvis was performed with the administration of 100 mL of iohexol  (OMNIPAQUE ) 300 MG/ML solution. Multiplanar reformatted images are provided for review. Automated exposure control, iterative reconstruction, and/or weight-based adjustment of the mA/kV was utilized to reduce the radiation dose to as low as reasonably achievable. COMPARISON: ct abd Pelvis 12/27/2019. CLINICAL HISTORY: Prior history of SBO s/p colectomy, now with similar pain, nausea, no bowel movements. Pt presented to ED with c/o abd pain, nausea, vomiting x 2 hours. States hx of GERD and SBO. States vomit has been undigested food. Pt also endorses diarrhea. A\T\Ox4. FINDINGS: LOWER CHEST: No acute abnormality. LIVER: The liver is unremarkable. GALLBLADDER AND BILE DUCTS: Gallbladder is unremarkable. No biliary ductal dilatation. SPLEEN: No acute abnormality. PANCREAS: No acute abnormality. ADRENAL GLANDS: No acute abnormality. KIDNEYS, URETERS AND BLADDER: No stones in the kidneys or ureters. No hydronephrosis. No perinephric or periureteral stranding. Urinary bladder is unremarkable. GI AND BOWEL: Stomach demonstrates no acute abnormality. Several loops of mid small bowel are dilated with fluid with caliber measuring up to 3.6 cm in associated air-fluid levels. There is a transition point within the lower abdomen as well as a second transition point shortly after that with a short loop of bowel between these transition points  also noted to be dilated with fluid (5.45). Large bowel is normal in caliber. No pneumatosis. PERITONEUM AND RETROPERITONEUM: No ascites. No free air. VASCULATURE: Aorta is normal in caliber. LYMPH NODES: No lymphadenopathy. REPRODUCTIVE ORGANS: No acute abnormality. BONES AND SOFT TISSUES: No acute osseous abnormality. No focal soft tissue abnormality. IMPRESSION: 1. High-grade small  bowel obstruction with a closed loop obstruction. 2. Nonobstructive bilateral nephrolithiasis. PRA to call report. Electronically signed by: Morgane Naveau MD 04/17/2024 02:45 AM EDT RP Workstation: [id number]        Laneta Blunt, DO [facility] 04/18/2024, 4:54 PM    Dictation software may have been used to generate the above note. Typos may occur and escape review in typed/dictated notes. Please contact Dr Blunt directly for clarity if needed.  Staff may message me via secure chat in Epic  but this may not receive an immediate response,  please page me for urgent matters!  If 7PM-7AM, please contact night coverage [email or web address]

## 2024-04-18 NOTE — Plan of Care (Signed)
   Problem: Education: Goal: Knowledge of General Education information will improve Description: Including pain rating scale, medication(s)/side effects and non-pharmacologic comfort measures Outcome: Progressing   Problem: Clinical Measurements: Goal: Will remain free from infection Outcome: Progressing Goal: Diagnostic test results will improve Outcome: Progressing

## 2024-04-18 NOTE — Progress Notes (Signed)
  [facility] (cpt [phone])  Hospital Day(s): 1.   Interval History: Patient seen and examined, no acute events or new complaints overnight. Patient reports she is feeling well. She reports very minimal, intermittent, soreness which is 2/10 at the worst. No fever, chills, nausea, emesis, distension. She denied any flatus. Labs this morning are reassuring. She did have KUB with continued small bowel dilation. She is out of bed; did a lap with me on the unit.   Review of Systems:  Constitutional: denies fever, chills  HEENT: denies cough or congestion  Respiratory: denies any shortness of breath  Cardiovascular: denies chest pain or palpitations  Gastrointestinal: + abdominal pain (minimal, 2/10, improved), denied distension, N/V Genitourinary: denies burning with urination or urinary frequency Musculoskeletal: denies pain, decreased motor or sensation  Vital signs in last 24 hours: [min-max] current  Temp:  [97.7 F (36.5 C)-99.3 F (37.4 C)] 97.7 F (36.5 C) (10/21 0700) Pulse Rate:  [81-99] 99 (10/21 0700) Resp:  [15-18] 17 (10/21 0700) BP: (124-138)/(71-85) 132/85 (10/21 0700) SpO2:  [99 %-100 %] 99 % (10/21 0700)     Height: 5' 1 (154.9 cm) Weight: 50.8 kg BMI (Calculated): 21.17   Intake/Output last 2 shifts:  10/20 0701 - 10/21 0700 In: 672 [I.V.:672] Out: -    Physical Exam:  Constitutional: alert, cooperative and no distress  HENT: normocephalic without obvious abnormality  Eyes: PERRL, EOM's grossly intact and symmetric  Respiratory: breathing non-labored at rest  Cardiovascular: regular rate and sinus rhythm  Gastrointestinal: soft, non-tender, no significant distension, she is tympanic expectedly given KUB findings, no rebound/guarding. She is certainly not peritonitic.    Labs:     Latest Ref Rng & Units 04/18/2024    4:21 AM 04/17/2024    5:14 AM 04/17/2024    1:01 AM  CBC  WBC 4.0 - 10.5 K/uL 5.4  11.3  12.0    Hemoglobin 12.0 - 15.0 g/dL 88.9  87.7  86.9   Hematocrit 36.0 - 46.0 % 33.6  38.3  40.8   Platelets 150 - 400 K/uL 153  196  191       Latest Ref Rng & Units 04/18/2024    4:21 AM 04/17/2024    5:14 AM 04/17/2024    1:01 AM  CMP  Glucose 70 - 99 mg/dL 91  874  811   BUN 8 - 23 mg/dL 15  15  15    Creatinine 0.44 - 1.00 mg/dL 9.22  9.22  8.98   Sodium 135 - 145 mmol/L 141  140  140   Potassium 3.5 - 5.1 mmol/L 3.6  3.6  3.5   Chloride 98 - 111 mmol/L 106  104  101   CO2 22 - 32 mmol/L 28  28  27    Calcium 8.9 - 10.3 mg/dL 8.4  8.9  9.5   Total Protein 6.5 - 8.1 g/dL  7.2  7.8   Total Bilirubin 0.0 - 1.2 mg/dL  0.6  0.8   Alkaline Phos 38 - 126 U/L  56  58   AST 15 - 41 U/L  27  28   ALT 0 - 44 U/L  15  15      Imaging studies:  KUB (04/18/2024) personally reviewed with continued distension of small bowel no free air, no significant air in the colon, and radiologist report pending...   Assessment/Plan: (ICD-10's: K66.609) 73 y.o. 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 She understands she may be slow to progress without this or if she develops nausea/emesis, worsening pain, I will recommend replacement  - Serial KUB; ordered for AM. If she fails to improve by tomorrow, we will proceed with gastrografin  challenge.  - No need for emergent surgical intervention. She understands if she fails to resolve, or deteriorates, we will need to consider this. She wishes for this to be last resort. We will give her time to resolve conservative given this would be second laparotomy for same   - NPO; okay sips with medications; will add gentle IVF             - Monitor abdominal examination; on-gong bowel function             - Pain control prn; antiemetics prn             - Mobilize; doing well             - Further  management per primary service; we will follow   All of the above findings and recommendations were discussed with the patient, and the medical team, and all of patient's questions were answered to her expressed satisfaction.  -- Arthea Platt, PA-C [facility] 04/18/2024, 7:44 AM M-F: 7am - 4pm

## 2024-04-19 ENCOUNTER — Inpatient Hospital Stay

## 2024-04-19 DIAGNOSIS — K56609 Unspecified intestinal obstruction, unspecified as to partial versus complete obstruction: Secondary | ICD-10-CM | POA: Diagnosis not present

## 2024-04-19 MED ORDER — DIATRIZOATE MEGLUMINE & SODIUM 66-10 % PO SOLN
90.0000 mL | Freq: Once | ORAL | Status: AC
  Administered 2024-04-19: 90 mL via ORAL

## 2024-04-19 NOTE — Progress Notes (Signed)
 Blood glucose spot check resulted 93 @ 2331 as patient remains nothing by mouth for conservative management of small bowel obstruction.  Plan of care continues.

## 2024-04-19 NOTE — Progress Notes (Signed)
 Mobility Specialist - Progress Note   04/19/24 1200  Mobility  Activity Ambulated with assistance;Stood at bedside  Level of Assistance Independent after set-up  Assistive Device  (IV Stand)  Distance Ambulated (ft) 185 ft  Range of Motion/Exercises Active Assistive  Activity Response Tolerated well  Mobility visit 1 Mobility  Mobility Specialist Start Time (ACUTE ONLY) [id number]  Mobility Specialist Stop Time (ACUTE ONLY) 0953  Mobility Specialist Time Calculation (min) (ACUTE ONLY) 10 min   Pt was supine in bed on RA upon entry. Pt agreed to mobility. Pt is connected to an IV stand. Pt had a BM before ambulation.Pt ambulated well. After activity pt returned to the room in bed with needs in reach.  Clem Rodes Mobility Specialist 04/19/24, 12:42 PM

## 2024-04-19 NOTE — Progress Notes (Signed)
 Patient had bowel motion X 2, small and medium.

## 2024-04-19 NOTE — Progress Notes (Signed)
 [facility] PROGRESS NOTE (cpt [phone])  Hospital Day(s): 2.   Interval History: Patient seen and examined, no acute events or new complaints overnight. Patient reports she is feeling good. Abdominal pain 1/10 at the worst. No fever, chills, nausea, emesis. No new labs this morning. She did have KUB with continued small bowel dilation; scant gas in left colon. She has had 1-2 episodes of flatus.   Review of Systems:  Constitutional: denies fever, chills  HEENT: denies cough or congestion  Respiratory: denies any shortness of breath  Cardiovascular: denies chest pain or palpitations  Gastrointestinal: + abdominal pain (minimal, 1/10, improved), denied distension, N/V Genitourinary: denies burning with urination or urinary frequency Musculoskeletal: denies pain, decreased motor or sensation  Vital signs in last 24 hours: [min-max] current  Temp:  [98 F (36.7 C)-98.7 F (37.1 C)] 98.7 F (37.1 C) (10/22 0455) Pulse Rate:  [84-93] 84 (10/22 0455) Resp:  [15-18] 15 (10/22 0455) BP: (120-123)/(75-79) 123/75 (10/22 0455) SpO2:  [100 %] 100 % (10/22 0455)     Height: 5' 1 (154.9 cm) Weight: 50.8 kg BMI (Calculated): 21.17   Intake/Output last 2 shifts:  10/21 0701 - 10/22 0700 In: 339.9 [I.V.:339.9] Out: -    Physical Exam:  Constitutional: alert, cooperative and no distress  HENT: normocephalic without obvious abnormality  Eyes: PERRL, EOM's grossly intact and symmetric  Respiratory: breathing non-labored at rest  Cardiovascular: regular rate and sinus rhythm  Gastrointestinal: soft, non-tender, no significant distension, she is tympanic expectedly given KUB findings, no rebound/guarding. She is certainly not peritonitic.    Labs:     Latest Ref Rng & Units 04/18/2024    4:21 AM 04/17/2024    5:14 AM 04/17/2024    1:01 AM  CBC  WBC 4.0 - 10.5 K/uL 5.4  11.3  12.0   Hemoglobin 12.0 - 15.0 g/dL 88.9  87.7  86.9   Hematocrit 36.0 - 46.0 % 33.6  38.3   40.8   Platelets 150 - 400 K/uL 153  196  191       Latest Ref Rng & Units 04/18/2024    4:21 AM 04/17/2024    5:14 AM 04/17/2024    1:01 AM  CMP  Glucose 70 - 99 mg/dL 91  874  811   BUN 8 - 23 mg/dL 15  15  15    Creatinine 0.44 - 1.00 mg/dL 9.22  9.22  8.98   Sodium 135 - 145 mmol/L 141  140  140   Potassium 3.5 - 5.1 mmol/L 3.6  3.6  3.5   Chloride 98 - 111 mmol/L 106  104  101   CO2 22 - 32 mmol/L 28  28  27    Calcium 8.9 - 10.3 mg/dL 8.4  8.9  9.5   Total Protein 6.5 - 8.1 g/dL  7.2  7.8   Total Bilirubin 0.0 - 1.2 mg/dL  0.6  0.8   Alkaline Phos 38 - 126 U/L  56  58   AST 15 - 41 U/L  27  28   ALT 0 - 44 U/L  15  15      Imaging studies:  KUB (04/19/2024) personally reviewed with continued distension of small bowel, scant air in left colon, no free air, and radiologist reviewed below:   IMPRESSION: 1. Findings consistent with small bowel obstruction, with multiple dilated small bowel loops in the mid abdomen measuring up to 5.3 cm in diameter. Unchanged from prior exam. 2. No free intraperitoneal air  detected.    Assessment/Plan: (ICD-10's: K67.609) 73 y.o. 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 I will go ahead and proceed with gastrografin  challenge today as we have not made significant progress to this point.   - She wishes to hold off on replacement of NGT for now, which is reasonable. She understands she may be slow to progress without this or if she develops nausea/emesis, worsening pain, I will recommend replacement  - No need for emergent surgical intervention. She understands if she fails to resolve, or deteriorates, we will need to consider this. She wishes for this to be last resort. We will give her time to resolve conservative given this would be second laparotomy for same   - NPO; okay sips with medications; gentle IVF             - Monitor  abdominal examination; on-gong bowel function             - Pain control prn; antiemetics prn             - Mobilize; doing well             - Further management per primary service; we will follow   All of the above findings and recommendations were discussed with the patient, and the medical team, and all of patient's questions were answered to her expressed satisfaction.  -- Arthea Platt, PA-C [facility] 04/19/2024, 7:24 AM M-F: 7am - 4pm

## 2024-04-19 NOTE — Plan of Care (Signed)

## 2024-04-19 NOTE — Progress Notes (Signed)
 Progress Note   Patient: Kim Potter FMW:[id number] DOB: 1951-04-14 DOA: 04/17/2024     2 DOS: the patient was seen and examined on 04/19/2024   Brief hospital course:   Kim Potter is a 73 y.o. African-American female with medical history significant for hypertension, dyslipidemia and GERD, who presented to the emergency room with abdominal pain    HPI: acute onset abdominal pain that initially started in the epigastric area. She had a bowel movement in the morning. She has been having nonbloody nonbilious vomitus over the diarrhea or melena or bright red bleeding per rectum.    10/20: to ED. CT Abd/Pelv with contrast (+)High-grade small  bowel obstruction with a closed loop obstruction. Dr. Lane of surgery was contacted by hospitalist - NG tube placement but this was malpositioned, pt reluctant for reinsertion - has declined and opt for conservative mgt reinsert If needed 10/21: passing flatus today, ambulating, per surgery sips and ice chips are ok for now and if doing well thru today can advance diet further tomorrow      Consultants:  General surgery    Procedures/Surgeries: none  ASSESSMENT & PLAN:   SBO (small bowel obstruction)  per surgery sips and ice chips are ok, and if doing well thru today can advance diet further tomorrow  General surgery following  Pain management  IV fluids    Essential hypertension as needed IV hydralazine. Restarted po amlodipine  and metoprolol  today  BP has been at goal, hold hydrochlorothiazide    Dyslipidemia Statin.   GERD without esophagitis IV PPI therapy   Allergies restart po meds fexofenadine, montelukast    DVT prophylaxis: lovenox  IV fluids: LR w/ dextrose continuous IV fluids until able to take po     TOC needs: TBD  Subjective / Brief ROS:  Seen and examined at bedside this morning Admits to improvement Denies nausea vomiting chest pain Remains n.p.o.   Family Communication: none at this time       Physical Exam Constitutional:      General: She is not in acute distress. Cardiovascular:     Rate and Rhythm: Normal rate and regular rhythm.  Abdominal:     General: Bowel sounds are increased.     Palpations: Abdomen is soft.     Tenderness: There is no abdominal tenderness.  Neurological:     Mental Status: She is alert and oriented to person, place, and time.  Psychiatric:        Mood and Affect: Mood normal.        Behavior: Behavior normal.       Data Reviewed:    Latest Ref Rng & Units 04/18/2024    4:21 AM 04/17/2024    5:14 AM 04/17/2024    1:01 AM  CBC  WBC 4.0 - 10.5 K/uL 5.4  11.3  12.0   Hemoglobin 12.0 - 15.0 g/dL 88.9  87.7  86.9   Hematocrit 36.0 - 46.0 % 33.6  38.3  40.8   Platelets 150 - 400 K/uL 153  196  191        Latest Ref Rng & Units 04/18/2024    4:21 AM 04/17/2024    5:14 AM 04/17/2024    1:01 AM  BMP  Glucose 70 - 99 mg/dL 91  874  811   BUN 8 - 23 mg/dL 15  15  15    Creatinine 0.44 - 1.00 mg/dL 9.22  9.22  8.98   Sodium 135 - 145 mmol/L 141  140  140  Potassium 3.5 - 5.1 mmol/L 3.6  3.6  3.5   Chloride 98 - 111 mmol/L 106  104  101   CO2 22 - 32 mmol/L 28  28  27    Calcium 8.9 - 10.3 mg/dL 8.4  8.9  9.5      Vitals:   04/18/24 1644 04/18/24 1939 04/19/24 0455 04/19/24 0830  BP: 123/79 120/79 123/75 (!) 128/90  Pulse: 93 84 84 80  Resp: 18 16 15 16   Temp: 98 F (36.7 C) 98.6 F (37 C) 98.7 F (37.1 C)   TempSrc:      SpO2: 100% 100% 100% 99%  Weight:      Height:         Time spent: 41 minutes  Author: Drue ONEIDA Potter, MD 04/19/2024 2:32 PM  For on call review [email or web address].

## 2024-04-20 ENCOUNTER — Inpatient Hospital Stay: Admitting: Anesthesiology

## 2024-04-20 ENCOUNTER — Encounter
Admission: EM | Disposition: A | Discharge status: Home / Self Care | Payer: Self-pay | Source: Home / Self Care | Attending: Internal Medicine

## 2024-04-20 ENCOUNTER — Encounter: Payer: Self-pay | Admitting: Family Medicine

## 2024-04-20 ENCOUNTER — Inpatient Hospital Stay

## 2024-04-20 DIAGNOSIS — K56609 Unspecified intestinal obstruction, unspecified as to partial versus complete obstruction: Secondary | ICD-10-CM | POA: Diagnosis not present

## 2024-04-20 DIAGNOSIS — K565 Intestinal adhesions [bands], unspecified as to partial versus complete obstruction: Secondary | ICD-10-CM

## 2024-04-20 HISTORY — PX: LYSIS OF ADHESION: SHX5961

## 2024-04-20 HISTORY — PX: LAPAROTOMY: SHX154

## 2024-04-20 LAB — BASIC METABOLIC PANEL WITH GFR
Anion gap: 8 (ref 5–15)
BUN: 8 mg/dL (ref 8–23)
CO2: 28 mmol/L (ref 22–32)
Calcium: 8.8 mg/dL — ABNORMAL LOW (ref 8.9–10.3)
Chloride: 105 mmol/L (ref 98–111)
Creatinine, Ser: 0.6 mg/dL (ref 0.44–1.00)
GFR, Estimated: >60 mL/min (ref >60)
Glucose, Bld: 124 mg/dL — ABNORMAL HIGH (ref 70–99)
Potassium: 3.4 mmol/L — ABNORMAL LOW (ref 3.5–5.1)
Sodium: 141 mmol/L (ref 135–145)

## 2024-04-20 LAB — CBC WITH DIFFERENTIAL/PLATELET
Abs Immature Granulocytes: 0.02 K/uL (ref 0.00–0.07)
Basophils Absolute: 0 K/uL (ref 0.0–0.1)
Basophils Relative: 0 %
Eosinophils Absolute: 0 K/uL (ref 0.0–0.5)
Eosinophils Relative: 0 %
HCT: 32 % — ABNORMAL LOW (ref 36.0–46.0)
Hemoglobin: 10.6 g/dL — ABNORMAL LOW (ref 12.0–15.0)
Immature Granulocytes: 0 %
Lymphocytes Relative: 6 %
Lymphs Abs: 0.4 K/uL — ABNORMAL LOW (ref 0.7–4.0)
MCH: 23.1 pg — ABNORMAL LOW (ref 26.0–34.0)
MCHC: 33.1 g/dL (ref 30.0–36.0)
MCV: 69.7 fL — ABNORMAL LOW (ref 80.0–100.0)
Monocytes Absolute: 0.5 K/uL (ref 0.1–1.0)
Monocytes Relative: 8 %
Neutro Abs: 5.9 K/uL (ref 1.7–7.7)
Neutrophils Relative %: 86 %
Platelets: 160 K/uL (ref 150–400)
RBC: 4.59 MIL/uL (ref 3.87–5.11)
RDW: 15.6 % — ABNORMAL HIGH (ref 11.5–15.5)
Smear Review: NORMAL
WBC: 6.9 K/uL (ref 4.0–10.5)
nRBC: 0 % (ref 0.0–0.2)

## 2024-04-20 LAB — TYPE AND SCREEN
ABO/RH(D): O POS
Antibody Screen: NEGATIVE

## 2024-04-20 LAB — ABO/RH: ABO/RH(D): O POS

## 2024-04-20 SURGERY — LAPAROTOMY, EXPLORATORY
Anesthesia: General | Site: Abdomen

## 2024-04-20 MED ORDER — BUPIVACAINE-EPINEPHRINE (PF) 0.25% -1:200000 IJ SOLN
INTRAMUSCULAR | Status: DC | PRN
  Administered 2024-04-20: 30 mL

## 2024-04-20 MED ORDER — ROCURONIUM BROMIDE 10 MG/ML (PF) SYRINGE
PREFILLED_SYRINGE | INTRAVENOUS | Status: AC
Start: 2024-04-20 — End: 2024-04-20
  Filled 2024-04-20: qty 10

## 2024-04-20 MED ORDER — SUGAMMADEX SODIUM 200 MG/2ML IV SOLN
INTRAVENOUS | Status: DC | PRN
  Administered 2024-04-20: 100 mg via INTRAVENOUS

## 2024-04-20 MED ORDER — FENTANYL CITRATE (PF) 100 MCG/2ML IJ SOLN
INTRAMUSCULAR | Status: AC
  Filled 2024-04-20: qty 2

## 2024-04-20 MED ORDER — MIDAZOLAM HCL 2 MG/2ML IJ SOLN
INTRAMUSCULAR | Status: AC
  Filled 2024-04-20: qty 2

## 2024-04-20 MED ORDER — SUCCINYLCHOLINE CHLORIDE 200 MG/10ML IV SOSY
PREFILLED_SYRINGE | INTRAVENOUS | Status: DC | PRN
  Administered 2024-04-20: 60 mg via INTRAVENOUS

## 2024-04-20 MED ORDER — LIDOCAINE HCL (CARDIAC) PF 100 MG/5ML IV SOSY
PREFILLED_SYRINGE | INTRAVENOUS | Status: DC | PRN
  Administered 2024-04-20: 60 mg via INTRAVENOUS

## 2024-04-20 MED ORDER — MORPHINE SULFATE (PF) 2 MG/ML IV SOLN
2.0000 mg | INTRAVENOUS | Status: DC | PRN
Start: 2024-04-20 — End: 2024-04-26
  Administered 2024-04-20 – 2024-04-23 (×4): 2 mg via INTRAVENOUS
  Filled 2024-04-20 (×4): qty 1

## 2024-04-20 MED ORDER — ONDANSETRON HCL 4 MG/2ML IJ SOLN
INTRAMUSCULAR | Status: AC
  Filled 2024-04-20: qty 2

## 2024-04-20 MED ORDER — ALBUMIN HUMAN 5 % IV SOLN: INTRAVENOUS | Status: DC | PRN

## 2024-04-20 MED ORDER — OXYCODONE HCL 5 MG/5ML PO SOLN: 5.0000 mg | Freq: Once | ORAL | Status: DC | PRN

## 2024-04-20 MED ORDER — ALBUMIN HUMAN 5 % IV SOLN
INTRAVENOUS | Status: AC
Start: 2024-04-20 — End: 2024-04-20
  Filled 2024-04-20: qty 250

## 2024-04-20 MED ORDER — OXYCODONE HCL 5 MG PO TABS: 5.0000 mg | ORAL_TABLET | Freq: Once | ORAL | Status: DC | PRN

## 2024-04-20 MED ORDER — PHENYLEPHRINE HCL-NACL 20-0.9 MG/250ML-% IV SOLN
INTRAVENOUS | Status: AC
  Filled 2024-04-20: qty 250

## 2024-04-20 MED ORDER — SODIUM CHLORIDE 0.9 % IV SOLN
INTRAVENOUS | Status: AC
  Filled 2024-04-20: qty 2

## 2024-04-20 MED ORDER — PHENYLEPHRINE 80 MCG/ML (10ML) SYRINGE FOR IV PUSH (FOR BLOOD PRESSURE SUPPORT)
PREFILLED_SYRINGE | INTRAVENOUS | Status: AC
  Filled 2024-04-20: qty 10

## 2024-04-20 MED ORDER — MIDAZOLAM HCL (PF) 2 MG/2ML IJ SOLN
INTRAMUSCULAR | Status: DC | PRN
Start: 2024-04-20 — End: 2024-04-20
  Administered 2024-04-20 (×2): 1 mg via INTRAVENOUS

## 2024-04-20 MED ORDER — FENTANYL CITRATE (PF) 100 MCG/2ML IJ SOLN
INTRAMUSCULAR | Status: DC | PRN
  Administered 2024-04-20 (×2): 50 ug via INTRAVENOUS

## 2024-04-20 MED ORDER — PROPOFOL 10 MG/ML IV BOLUS
INTRAVENOUS | Status: AC
Start: 2024-04-20 — End: 2024-04-20
  Filled 2024-04-20: qty 20

## 2024-04-20 MED ORDER — 0.9 % SODIUM CHLORIDE (POUR BTL) OPTIME
TOPICAL | Status: DC | PRN
  Administered 2024-04-20: 3000 mL

## 2024-04-20 MED ORDER — SODIUM CHLORIDE 0.9 % IV SOLN
1.0000 g | Freq: Once | INTRAVENOUS | Status: AC
  Administered 2024-04-20: 1 g via INTRAVENOUS
  Filled 2024-04-20 (×2): qty 1

## 2024-04-20 MED ORDER — PROPOFOL 10 MG/ML IV BOLUS
INTRAVENOUS | Status: DC | PRN
  Administered 2024-04-20: 100 mg via INTRAVENOUS

## 2024-04-20 MED ORDER — SUCCINYLCHOLINE CHLORIDE 200 MG/10ML IV SOSY
PREFILLED_SYRINGE | INTRAVENOUS | Status: AC
  Filled 2024-04-20: qty 10

## 2024-04-20 MED ORDER — DEXAMETHASONE SOD PHOSPHATE PF 10 MG/ML IJ SOLN
INTRAMUSCULAR | Status: DC | PRN
  Administered 2024-04-20: 8 mg via INTRAVENOUS

## 2024-04-20 MED ORDER — 0.9 % SODIUM CHLORIDE (POUR BTL) OPTIME
TOPICAL | Status: DC | PRN
  Administered 2024-04-20: 500 mL

## 2024-04-20 MED ORDER — PHENYLEPHRINE HCL-NACL 20-0.9 MG/250ML-% IV SOLN
INTRAVENOUS | Status: DC | PRN
  Administered 2024-04-20: 40 ug/min via INTRAVENOUS

## 2024-04-20 MED ORDER — POTASSIUM CHLORIDE 10 MEQ/100ML IV SOLN: 10.0000 meq | INTRAVENOUS | Status: AC

## 2024-04-20 MED ORDER — ROCURONIUM BROMIDE 100 MG/10ML IV SOLN
INTRAVENOUS | Status: DC | PRN
  Administered 2024-04-20: 40 mg via INTRAVENOUS

## 2024-04-20 MED ORDER — PHENYLEPHRINE 80 MCG/ML (10ML) SYRINGE FOR IV PUSH (FOR BLOOD PRESSURE SUPPORT)
PREFILLED_SYRINGE | INTRAVENOUS | Status: DC | PRN
  Administered 2024-04-20 (×3): 80 ug via INTRAVENOUS
  Administered 2024-04-20: 160 ug via INTRAVENOUS
  Administered 2024-04-20: 80 ug via INTRAVENOUS

## 2024-04-20 MED ORDER — LACTATED RINGERS IV SOLN: INTRAVENOUS | Status: DC | PRN

## 2024-04-20 MED ORDER — ACETAMINOPHEN 10 MG/ML IV SOLN
INTRAVENOUS | Status: DC | PRN
  Administered 2024-04-20: 800 mg via INTRAVENOUS

## 2024-04-20 MED ORDER — ACETAMINOPHEN 10 MG/ML IV SOLN
INTRAVENOUS | Status: AC
Start: 2024-04-20 — End: 2024-04-20
  Filled 2024-04-20: qty 100

## 2024-04-20 MED ORDER — FENTANYL CITRATE (PF) 100 MCG/2ML IJ SOLN
25.0000 ug | INTRAMUSCULAR | Status: DC | PRN
  Administered 2024-04-20 (×2): 25 ug via INTRAVENOUS

## 2024-04-20 SURGICAL SUPPLY — 32 items
BLADE CLIPPER SURG (BLADE) IMPLANT
BLADE SURG 15 STRL LF DISP TIS (BLADE) IMPLANT
CHLORAPREP W/TINT 26 (MISCELLANEOUS) ×2 IMPLANT
CLIP APPLIE 11 MED OPEN (CLIP) IMPLANT
CLIP APPLIE 13 LRG OPEN (CLIP) IMPLANT
DRAPE C-SECTION (MISCELLANEOUS) ×2 IMPLANT
DRAPE TABLE BACK 80X90 (DRAPES) ×2 IMPLANT
DRAPE WARM FLUID 44X44 (DRAPES) IMPLANT
DRSG OPSITE POSTOP 4X8 (GAUZE/BANDAGES/DRESSINGS) IMPLANT
ELECT BLADE 6.5 EXT (BLADE) ×2 IMPLANT
ELECTRODE REM PT RTRN 9FT ADLT (ELECTROSURGICAL) ×2 IMPLANT
GAUZE 4X4 16PLY ~~LOC~~+RFID DBL (SPONGE) IMPLANT
GLOVE ORTHO TXT STRL SZ7.5 (GLOVE) ×4 IMPLANT
GOWN STRL REUS W/ TWL LRG LVL3 (GOWN DISPOSABLE) ×4 IMPLANT
GOWN STRL REUS W/ TWL XL LVL3 (GOWN DISPOSABLE) ×4 IMPLANT
HANDLE YANKAUER SUCT BULB TIP (MISCELLANEOUS) IMPLANT
LIGASURE IMPACT 36 18CM CVD LR (INSTRUMENTS) IMPLANT
MANIFOLD NEPTUNE II (INSTRUMENTS) ×2 IMPLANT
PACK BASIN MAJOR ARMC (MISCELLANEOUS) ×2 IMPLANT
PACK COLON CLEAN CLOSURE (MISCELLANEOUS) IMPLANT
SEPRAFILM MEMBRANE 5X6 (MISCELLANEOUS) IMPLANT
SOLN 0.9% NACL POUR BTL 1000ML (IV SOLUTION) ×2 IMPLANT
SOLN 0.9% NACL POUR BTL 500 ML (IV SOLUTION) IMPLANT
SPONGE T-LAP 18X18 ~~LOC~~+RFID (SPONGE) IMPLANT
STAPLER SKIN PROX 35W (STAPLE) ×2 IMPLANT
SUT PDS AB 0 CT1 27 (SUTURE) IMPLANT
SUT PDS AB 1 CT 36 (SUTURE) ×6 IMPLANT
SUT SILK 3 0 SH CR/8 (SUTURE) ×2 IMPLANT
SUT VICRYL 2-0 54IN ABS (SUTURE) IMPLANT
TRAP FLUID SMOKE EVACUATOR (MISCELLANEOUS) ×2 IMPLANT
TRAY FOLEY MTR SLVR 16FR STAT (SET/KITS/TRAYS/PACK) ×2 IMPLANT
WATER STERILE IRR 500ML POUR (IV SOLUTION) ×2 IMPLANT

## 2024-04-20 NOTE — Interval H&P Note (Signed)
 History and Physical Interval Note:  04/20/2024 12:53 PM  Kim Potter  has presented today for surgery, with the diagnosis of Small bowel obstruction.  The various methods of treatment have been discussed with the patient and family. After consideration of risks, benefits and other options for treatment, the patient has consented to  Procedure(s): LAPAROTOMY, EXPLORATORY (N/A) as a surgical intervention.  The patient's history has been reviewed, patient examined, no change in status, stable for surgery.  I have reviewed the patient's chart and labs.  Questions were answered to the patient's satisfaction.     Honor Leghorn

## 2024-04-20 NOTE — Anesthesia Postprocedure Evaluation (Signed)
 Anesthesia Post Note  Patient: Kim Potter  Procedure(s) Performed: LAPAROTOMY, EXPLORATORY (Abdomen) LYSIS OF ADHESIONS (Abdomen)  Patient location during evaluation: PACU Anesthesia Type: General Level of consciousness: awake and alert Pain management: pain level controlled Vital Signs Assessment: post-procedure vital signs reviewed and stable Respiratory status: spontaneous breathing, nonlabored ventilation, respiratory function stable and patient connected to nasal cannula oxygen Cardiovascular status: blood pressure returned to baseline and stable Postop Assessment: no apparent nausea or vomiting Anesthetic complications: no   No notable events documented.   Last Vitals:  Vitals:   04/20/24 1600 04/20/24 1615  BP: (!) 104/92 (!) 141/74  Pulse: 71 67  Resp: 13 13  Temp:    SpO2: 100% 98%    Last Pain:  Vitals:   04/20/24 1644  TempSrc:   PainSc: 0-No pain                 Debby Mines

## 2024-04-20 NOTE — H&P (View-Only) (Signed)
 [facility] PROGRESS NOTE (cpt [phone])  Hospital Day(s): 3.   Interval History: Patient seen and examined, no acute events or new complaints overnight. Patient reports she is feeling well. She continues to endorse very minimal 1/10 abdominal pain intermittently. She denied any nausea or emesis. She has remained distended. She has remained without leukocytosis; WBC 6.9K. Hgb to 10.6 which is relatively stable. Renal function normal; sCr - 0.60; UO - unmeasured. Mild hypokalemia to 3.4. She did have gastrografin  challenge yesterday and at 8 hour there was not significnat progression t the colon. This morning KUB with gastrografin  in the colon however small bowel remains markedly distended. She is having bowel function.   Review of Systems:  Constitutional: denies fever, chills  HEENT: denies cough or congestion  Respiratory: denies any shortness of breath  Cardiovascular: denies chest pain or palpitations  Gastrointestinal: + abdominal pain (minimal, 1/10, improved), denied distension, N/V Genitourinary: denies burning with urination or urinary frequency Musculoskeletal: denies pain, decreased motor or sensation  Vital signs in last 24 hours: [min-max] current  Temp:  [97.5 F (36.4 C)-98.5 F (36.9 C)] 98.5 F (36.9 C) (10/23 0443) Pulse Rate:  [62-80] 74 (10/23 0443) Resp:  [16-20] 20 (10/23 0443) BP: (124-145)/(74-94) 124/94 (10/23 0443) SpO2:  [99 %-100 %] 100 % (10/23 0443)     Height: 5' 1 (154.9 cm) Weight: 50.8 kg BMI (Calculated): 21.17   Intake/Output last 2 shifts:  10/22 0701 - 10/23 0700 In: 2195.5 [P.O.:120; I.V.:2075.5] Out: -    Physical Exam:  Constitutional: alert, cooperative and no distress  HENT: normocephalic without obvious abnormality  Eyes: PERRL, EOM's grossly intact and symmetric  Respiratory: breathing non-labored at rest  Cardiovascular: regular rate and sinus rhythm  Gastrointestinal: soft, non-tender, no significant  distension, she is tympanic expectedly given KUB findings, no rebound/guarding. She is certainly not peritonitic.    Labs:     Latest Ref Rng & Units 04/20/2024    3:44 AM 04/18/2024    4:21 AM 04/17/2024    5:14 AM  CBC  WBC 4.0 - 10.5 K/uL 6.9  5.4  11.3   Hemoglobin 12.0 - 15.0 g/dL 89.3  88.9  87.7   Hematocrit 36.0 - 46.0 % 32.0  33.6  38.3   Platelets 150 - 400 K/uL 160  153  196       Latest Ref Rng & Units 04/20/2024    3:44 AM 04/18/2024    4:21 AM 04/17/2024    5:14 AM  CMP  Glucose 70 - 99 mg/dL 875  91  874   BUN 8 - 23 mg/dL 8  15  15    Creatinine 0.44 - 1.00 mg/dL 9.39  9.22  9.22   Sodium 135 - 145 mmol/L 141  141  140   Potassium 3.5 - 5.1 mmol/L 3.4  3.6  3.6   Chloride 98 - 111 mmol/L 105  106  104   CO2 22 - 32 mmol/L 28  28  28    Calcium 8.9 - 10.3 mg/dL 8.8  8.4  8.9   Total Protein 6.5 - 8.1 g/dL   7.2   Total Bilirubin 0.0 - 1.2 mg/dL   0.6   Alkaline Phos 38 - 126 U/L   56   AST 15 - 41 U/L   27   ALT 0 - 44 U/L   15      Imaging studies:  KUB 8-Hr Gastrografin  (04/19/2024) personally reviewed without significant progression to the colon, small bowel  dilation, and radiologist report reviewed below:  IMPRESSION: 1. Findings compatible with small bowel obstruction.   KUB 24-Hr Gastrografin  (04/20/2024) personally reivewed now with progression of contrast to the right colon however her small bowel remains markedly distended, and radiologist report pending...    Assessment/Plan: (ICD-10's: K63.609) 73 y.o. 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 She has in theory passed her gastrografin  challenge given contrast in the right colon this AM on KUB at 24 hours. She is having bowel function. However, her KUB continues to have marked small bowel dilation and she is certainly still distended and tympanic. I do think we are somewhat in a grey area. I am concerned  that she likely has some degree of adhesions casing a partial enough blockage to allow passage of gastrografin  however there has not been any significant improvement in her KUB in 4 days now. I do think it would still be worthwhile to consider lysis of adhesion. Of course, she has a history of similar 4 years ago and this is a recurrence, so she is understanding this remains a risk with surgery. I fear that she will recur at some point in the near future given her lack of progression in the last 4 days. She is very understanding of her disease process and options as outlined. She wishes to discuss with her family which is reasonable. We have tentatively reserved space in the OR today. She did inquire about waiting another 24 hours which is reasonable, I do worry about nutritional status the longer we wait.    - Continue NPO for now; okay sips with medications; gentle IVF             - Monitor abdominal examination; on-gong bowel function             - Pain control prn; antiemetics prn             - Mobilize; doing well             - Further management per primary service; we will follow   All of the above findings and recommendations were discussed with the patient, and the medical team, and all of patient's questions were answered to her expressed satisfaction.  -- Arthea Platt, PA-C [facility] 04/20/2024, 7:10 AM M-F: 7am - 4pm

## 2024-04-20 NOTE — Op Note (Signed)
 Exploratory laparotomy with lysis of adhesions  Pre-operative Diagnosis: Small bowel obstruction secondary to postoperative adhesions  Post-operative Diagnosis: same.    Surgeon: Honor Leghorn, M.D., [facility]  Anesthesia: General Endotracheal  Findings: Extensive adhesions to the anterior infraumbilical midline incision, and interloop adhesions throughout the abdomen.  Definite point of transition from dilated proximal small bowel to distal decompressed bowel with clear transition point and adhesive process present.  No enterotomies created, no succus built.  A couple small serosal injuries and thin areas were reinforced with Lembert sutures of 3-0 silk.  Estimated Blood Loss: 150 mL         Specimens: None          Complications: none              Procedure Details  The patient was seen again in the Holding Room. The benefits, complications, treatment options, and expected outcomes were discussed with the patient. The risks of bleeding, infection, recurrence of symptoms, failure to resolve symptoms, unanticipated injury, prosthetic placement, prosthetic infection, any of which could require further surgery were reviewed with the patient. The likelihood of improving the patient's symptoms with return to their baseline status is hopeful.  The patient and/or family concurred with the proposed plan, giving informed consent.  The patient was taken to Operating Room, identified and the procedure verified.    Prior to the induction of general anesthesia, antibiotic prophylaxis was administered. VTE prophylaxis was in place.  General anesthesia was then administered and tolerated well. After the induction, the patient was positioned in the supine position and the abdomen was prepped with  Chloraprep and draped in the sterile fashion with C-section drape.  A Time Out was held and the above information confirmed.  A supraumbilical midline incision was made and extended down through the old scar in the  infraumbilical line.  This is extended and I entered the peritoneum in the supraumbilical site free of adhesions.  I gradually opened the lower portion of the midline incision little by little while lysing carefully the adhesions to the anterior abdominal wall.  This is where I entered into serosal defect and a very thinned area of dilated small bowel.  I utilized 3-0 silk Lembert sutures to reinforce this region. We then continued to perform lysis of adhesions utilizing a combination of scissors, times with 15 blade, and times with electrocautery.  Occasional bleeding from adjacent mesenteric vessels were controlled with electrocautery.  This was done meticulously, and continued until the entire small bowel was freed of all adhesions.   She had a very mobile and redundant cecum which extended down into the pelvis, the appendix was unremarkable.  The omentum was scarred to the bowel and right colon, this was freed.  She had a very loose and redundant transverse colon along with the sigmoid colon being very mobile and redundant.  Completed the NG tube to be well within the stomach and it was advanced to my liking. We then milked the small bowel content from the dilated portion retrograde via the duodenum and out the NG tube.  The cecum was obviously patent with egress of succus from the proximal small bowel distally as well.  It was very readily apparent that the right colon and transverse colon were patent as they received this fluid. Certain there was no other injuries or hemostasis to obtain we then irrigated the abdomen with multiple aliquots of warm normal saline.  Once this was completed and aspirated we then placed 3 separate sheets, large  sheets of Seprafilm 1 in the pelvis and 1 on each side of the abdomen.  We then reapproximated the midline with a running 0 PDS suture after ensuring the omentum would be midline and pulling the redundant transverse colon as far inferiorly as possible.  We then irrigated  subcutaneous tissues confirmed adequate hemostasis infiltrated local anesthesia quarter percent Marcaine  with epinephrine .  We then closed the skin with staples and applied a dry honeycomb dressing. She tolerated procedure well.  She subsequently extubated and taken to recovery room in stable condition.   Honor Leghorn M.D., [facility] 04/20/2024 3:47 PM

## 2024-04-20 NOTE — Anesthesia Preprocedure Evaluation (Signed)
 Anesthesia Evaluation  Patient identified by MRN, date of birth, ID band Patient awake    Reviewed: Allergy & Precautions, NPO status , Patient's Chart, lab work & pertinent test results  History of Anesthesia Complications Negative for: history of anesthetic complications  Airway Mallampati: III  TM Distance: >3 FB Neck ROM: full    Dental  (+) Edentulous Upper, Edentulous Lower   Pulmonary neg pulmonary ROS   Pulmonary exam normal        Cardiovascular hypertension, On Medications negative cardio ROS Normal cardiovascular exam     Neuro/Psych negative neurological ROS  negative psych ROS   GI/Hepatic Neg liver ROS,GERD  Medicated,,SBO   Endo/Other  negative endocrine ROS    Renal/GU      Musculoskeletal   Abdominal   Peds  Hematology negative hematology ROS (+)   Anesthesia Other Findings Past Medical History: No date: GERD (gastroesophageal reflux disease) No date: Hyperlipidemia No date: Hypertension No date: Wears dentures     Comment:  full upper and lower  Past Surgical History: 04/22/2022: CATARACT EXTRACTION W/PHACO; Right     Comment:  Procedure: CATARACT EXTRACTION PHACO AND INTRAOCULAR               LENS PLACEMENT (IOC) RIGHT;  Surgeon: Mittie Gaskin, MD;  Location: [facility];  Service:               Ophthalmology;  Laterality: Right;  7.94 01:03.7 12/29/2019: LAPAROTOMY; N/A     Comment:  Procedure: EXPLORATORY LAPAROTOMY;  Surgeon: Tye Millet, DO;  Location: [facility];  Service: General;                Laterality: N/A; 12/29/2019: LYSIS OF ADHESION; N/A     Comment:  Procedure: LYSIS OF ADHESION;  Surgeon: Tye Millet,               DO;  Location: [facility];  Service: General;  Laterality:               N/A; No date: PARTIAL HYSTERECTOMY  BMI    Body Mass Index: 21.16 kg/m      Reproductive/Obstetrics negative OB ROS                               Anesthesia Physical Anesthesia Plan  ASA: 3  Anesthesia Plan: General ETT   Post-op Pain Management: Minimal or no pain anticipated   Induction: Intravenous and Rapid sequence  PONV Risk Score and Plan: 2 and Ondansetron , Dexamethasone , Midazolam  and Treatment may vary due to age or medical condition  Airway Management Planned: Oral ETT  Additional Equipment:   Intra-op Plan:   Post-operative Plan: Extubation in OR  Informed Consent: I have reviewed the patients History and Physical, chart, labs and discussed the procedure including the risks, benefits and alternatives for the proposed anesthesia with the patient or authorized representative who has indicated his/her understanding and acceptance.     Dental Advisory Given  Plan Discussed with: Anesthesiologist, CRNA and Surgeon  Anesthesia Plan Comments: (Patient consented for risks of anesthesia including but not limited to:  - adverse reactions to medications - damage to eyes, teeth, lips or other oral mucosa - nerve damage due to positioning  - sore throat or hoarseness - Damage to heart,  brain, nerves, lungs, other parts of body or loss of life  Patient voiced understanding and assent.)        Anesthesia Quick Evaluation

## 2024-04-20 NOTE — Progress Notes (Signed)
 Progress Note   Patient: Kim Potter FMW:[id number] DOB: 01/03/1951 DOA: 04/17/2024     3 DOS: the patient was seen and examined on 04/20/2024    Brief hospital course:    KRISHAUNA SCHATZMAN is a 73 y.o. African-American female with medical history significant for hypertension, dyslipidemia and GERD, who presented to the emergency room with abdominal pain.  Patient found to have high-grade small bowel obstruction with closed-loop obstruction.  General surgery following   Consultants:  General surgery    Procedures/Surgeries: none   ASSESSMENT & PLAN:   SBO (small bowel obstruction)  per surgery sips and ice chips are ok, and if doing well thru today can advance diet further tomorrow  General surgery following  Pain management  IV fluids    Essential hypertension as needed IV hydralazine. Restarted po amlodipine  and metoprolol  today  BP has been at goal, hold hydrochlorothiazide    Dyslipidemia Statin.   GERD without esophagitis IV PPI therapy   Allergies restart po meds fexofenadine, montelukast     DVT prophylaxis: lovenox  IV fluids: LR w/ dextrose continuous IV fluids until able to take po      TOC needs: TBD   Subjective Patient still remains n.p.o. Surgery and planning surgical intervention today She denies nausea vomiting or worsening abdominal pain   Family Communication: none at this time      Physical Exam Constitutional:      General: She is not in acute distress. Cardiovascular:     Rate and Rhythm: Normal rate and regular rhythm.  Abdominal:     General: Bowel sounds are increased.     Palpations: Abdomen is soft.     Tenderness: There is no abdominal tenderness.  Neurological:     Mental Status: She is alert and oriented to person, place, and time.  Psychiatric:        Mood and Affect: Mood normal.        Behavior: Behavior normal.       Data Reviewed:    Latest Ref Rng & Units 04/20/2024    3:44 AM 04/18/2024    4:21 AM 04/17/2024     5:14 AM  CBC  WBC 4.0 - 10.5 K/uL 6.9  5.4  11.3   Hemoglobin 12.0 - 15.0 g/dL 89.3  88.9  87.7   Hematocrit 36.0 - 46.0 % 32.0  33.6  38.3   Platelets 150 - 400 K/uL 160  153  196        Latest Ref Rng & Units 04/20/2024    3:44 AM 04/18/2024    4:21 AM 04/17/2024    5:14 AM  BMP  Glucose 70 - 99 mg/dL 875  91  874   BUN 8 - 23 mg/dL 8  15  15    Creatinine 0.44 - 1.00 mg/dL 9.39  9.22  9.22   Sodium 135 - 145 mmol/L 141  141  140   Potassium 3.5 - 5.1 mmol/L 3.4  3.6  3.6   Chloride 98 - 111 mmol/L 105  106  104   CO2 22 - 32 mmol/L 28  28  28    Calcium 8.9 - 10.3 mg/dL 8.8  8.4  8.9     Vitals:   04/20/24 1538 04/20/24 1545 04/20/24 1600 04/20/24 1615  BP: (!) 143/78 (!) 157/84 (!) 104/92 (!) 141/74  Pulse: 76 81 71 67  Resp: 14 18 13 13   Temp: 97.6 F (36.4 C)     TempSrc:      SpO2:  100% 100% 100% 98%  Weight:      Height:         Author: Drue ONEIDA Potter, MD 04/20/2024 4:36 PM  For on call review [email or web address].

## 2024-04-20 NOTE — Care Management Important Message (Signed)
 Important Message  Patient Details  Name: Kim Potter MRN: [id number] Date of Birth: August 16, 1950   Important Message Given:  Yes - Medicare IM     BRANDY CHRISTIANE ORN, CMA 04/20/2024, 12:22 PM

## 2024-04-20 NOTE — Transfer of Care (Signed)
 Immediate Anesthesia Transfer of Care Note  Patient: Kim Potter  Procedure(s) Performed: LAPAROTOMY, EXPLORATORY (Abdomen) LYSIS OF ADHESIONS (Abdomen)  Patient Location: PACU  Anesthesia Type:General  Level of Consciousness: awake, alert , and drowsy  Airway & Oxygen Therapy: Patient Spontanous Breathing and Patient connected to face mask oxygen  Post-op Assessment: Report given to RN and Post -op Vital signs reviewed and stable  Post vital signs: Reviewed and stable  Last Vitals:  Vitals Value Taken Time  BP 143/78 04/20/24 15:37  Temp 35.8 1537  Pulse 76 04/20/24 15:40  Resp 12 04/20/24 15:40  SpO2 100 % 04/20/24 15:40  Vitals shown include unfiled device data.  Last Pain:  Vitals:   04/20/24 1250  TempSrc: Temporal  PainSc: 0-No pain         Complications: No notable events documented.

## 2024-04-20 NOTE — Plan of Care (Signed)

## 2024-04-20 NOTE — Anesthesia Procedure Notes (Signed)
 Procedure Name: Intubation Date/Time: 04/20/2024 1:08 PM  Performed by: Elly Pfeiffer, CRNAPre-anesthesia Checklist: Patient identified, Emergency Drugs available, Suction available and Patient being monitored Patient Re-evaluated:Patient Re-evaluated prior to induction Oxygen Delivery Method: Circle system utilized Preoxygenation: Pre-oxygenation with 100% oxygen Induction Type: IV induction Ventilation: Mask ventilation without difficulty Laryngoscope Size: Mac and 4 Grade View: Grade II Tube type: Oral Tube size: 7.0 mm Number of attempts: 1 Airway Equipment and Method: Stylet and Oral airway Placement Confirmation: ETT inserted through vocal cords under direct vision, positive ETCO2 and breath sounds checked- equal and bilateral Secured at: 22 cm Tube secured with: Tape Dental Injury: Teeth and Oropharynx as per pre-operative assessment  Comments: Cords clear; no trauma. CA

## 2024-04-20 NOTE — Progress Notes (Signed)
 [facility] SURGICAL PROGRESS NOTE (cpt [phone])  Hospital Day(s): 3.   Interval History: Patient seen and examined, no acute events or new complaints overnight. Patient reports she is feeling well. She continues to endorse very minimal 1/10 abdominal pain intermittently. She denied any nausea or emesis. She has remained distended. She has remained without leukocytosis; WBC 6.9K. Hgb to 10.6 which is relatively stable. Renal function normal; sCr - 0.60; UO - unmeasured. Mild hypokalemia to 3.4. She did have gastrografin  challenge yesterday and at 8 hour there was not significnat progression t the colon. This morning KUB with gastrografin  in the colon however small bowel remains markedly distended. She is having bowel function.   Review of Systems:  Constitutional: denies fever, chills  HEENT: denies cough or congestion  Respiratory: denies any shortness of breath  Cardiovascular: denies chest pain or palpitations  Gastrointestinal: + abdominal pain (minimal, 1/10, improved), denied distension, N/V Genitourinary: denies burning with urination or urinary frequency Musculoskeletal: denies pain, decreased motor or sensation  Vital signs in last 24 hours: [min-max] current  Temp:  [97.5 F (36.4 C)-98.5 F (36.9 C)] 98.5 F (36.9 C) (10/23 0443) Pulse Rate:  [62-80] 74 (10/23 0443) Resp:  [16-20] 20 (10/23 0443) BP: (124-145)/(74-94) 124/94 (10/23 0443) SpO2:  [99 %-100 %] 100 % (10/23 0443)     Height: 5' 1 (154.9 cm) Weight: 50.8 kg BMI (Calculated): 21.17   Intake/Output last 2 shifts:  10/22 0701 - 10/23 0700 In: 2195.5 [P.O.:120; I.V.:2075.5] Out: -    Physical Exam:  Constitutional: alert, cooperative and no distress  HENT: normocephalic without obvious abnormality  Eyes: PERRL, EOM's grossly intact and symmetric  Respiratory: breathing non-labored at rest  Cardiovascular: regular rate and sinus rhythm  Gastrointestinal: soft, non-tender, no significant  distension, she is tympanic expectedly given KUB findings, no rebound/guarding. She is certainly not peritonitic.    Labs:     Latest Ref Rng & Units 04/20/2024    3:44 AM 04/18/2024    4:21 AM 04/17/2024    5:14 AM  CBC  WBC 4.0 - 10.5 K/uL 6.9  5.4  11.3   Hemoglobin 12.0 - 15.0 g/dL 89.3  88.9  87.7   Hematocrit 36.0 - 46.0 % 32.0  33.6  38.3   Platelets 150 - 400 K/uL 160  153  196       Latest Ref Rng & Units 04/20/2024    3:44 AM 04/18/2024    4:21 AM 04/17/2024    5:14 AM  CMP  Glucose 70 - 99 mg/dL 875  91  874   BUN 8 - 23 mg/dL 8  15  15    Creatinine 0.44 - 1.00 mg/dL 9.39  9.22  9.22   Sodium 135 - 145 mmol/L 141  141  140   Potassium 3.5 - 5.1 mmol/L 3.4  3.6  3.6   Chloride 98 - 111 mmol/L 105  106  104   CO2 22 - 32 mmol/L 28  28  28    Calcium 8.9 - 10.3 mg/dL 8.8  8.4  8.9   Total Protein 6.5 - 8.1 g/dL   7.2   Total Bilirubin 0.0 - 1.2 mg/dL   0.6   Alkaline Phos 38 - 126 U/L   56   AST 15 - 41 U/L   27   ALT 0 - 44 U/L   15      Imaging studies:  KUB 8-Hr Gastrografin  (04/19/2024) personally reviewed without significant progression to the colon, small bowel  dilation, and radiologist report reviewed below:  IMPRESSION: 1. Findings compatible with small bowel obstruction.   KUB 24-Hr Gastrografin  (04/20/2024) personally reivewed now with progression of contrast to the right colon however her small bowel remains markedly distended, and radiologist report pending...    Assessment/Plan: (ICD-10's: K63.609) 73 y.o. 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 She has in theory passed her gastrografin  challenge given contrast in the right colon this AM on KUB at 24 hours. She is having bowel function. However, her KUB continues to have marked small bowel dilation and she is certainly still distended and tympanic. I do think we are somewhat in a grey area. I am concerned  that she likely has some degree of adhesions casing a partial enough blockage to allow passage of gastrografin  however there has not been any significant improvement in her KUB in 4 days now. I do think it would still be worthwhile to consider lysis of adhesion. Of course, she has a history of similar 4 years ago and this is a recurrence, so she is understanding this remains a risk with surgery. I fear that she will recur at some point in the near future given her lack of progression in the last 4 days. She is very understanding of her disease process and options as outlined. She wishes to discuss with her family which is reasonable. We have tentatively reserved space in the OR today. She did inquire about waiting another 24 hours which is reasonable, I do worry about nutritional status the longer we wait.    - Continue NPO for now; okay sips with medications; gentle IVF             - Monitor abdominal examination; on-gong bowel function             - Pain control prn; antiemetics prn             - Mobilize; doing well             - Further management per primary service; we will follow   All of the above findings and recommendations were discussed with the patient, and the medical team, and all of patient's questions were answered to her expressed satisfaction.  -- Arthea Platt, PA-C [facility] 04/20/2024, 7:10 AM M-F: 7am - 4pm

## 2024-04-21 ENCOUNTER — Other Ambulatory Visit: Payer: Self-pay

## 2024-04-21 ENCOUNTER — Encounter: Payer: Self-pay | Admitting: Surgery

## 2024-04-21 DIAGNOSIS — K56609 Unspecified intestinal obstruction, unspecified as to partial versus complete obstruction: Secondary | ICD-10-CM | POA: Diagnosis not present

## 2024-04-21 LAB — CBC WITH DIFFERENTIAL/PLATELET
Abs Immature Granulocytes: 0.02 K/uL (ref 0.00–0.07)
Basophils Absolute: 0 K/uL (ref 0.0–0.1)
Basophils Relative: 0 %
Eosinophils Absolute: 0 K/uL (ref 0.0–0.5)
Eosinophils Relative: 0 %
HCT: 35.9 % — ABNORMAL LOW (ref 36.0–46.0)
Hemoglobin: 11.9 g/dL — ABNORMAL LOW (ref 12.0–15.0)
Immature Granulocytes: 0 %
Lymphocytes Relative: 4 %
Lymphs Abs: 0.2 K/uL — ABNORMAL LOW (ref 0.7–4.0)
MCH: 22.9 pg — ABNORMAL LOW (ref 26.0–34.0)
MCHC: 33.1 g/dL (ref 30.0–36.0)
MCV: 69.2 fL — ABNORMAL LOW (ref 80.0–100.0)
Monocytes Absolute: 0.3 K/uL (ref 0.1–1.0)
Monocytes Relative: 4 %
Neutro Abs: 5.8 K/uL (ref 1.7–7.7)
Neutrophils Relative %: 92 %
Platelets: 191 K/uL (ref 150–400)
RBC: 5.19 MIL/uL — ABNORMAL HIGH (ref 3.87–5.11)
RDW: 15.6 % — ABNORMAL HIGH (ref 11.5–15.5)
Smear Review: NORMAL
WBC: 6.3 K/uL (ref 4.0–10.5)
nRBC: 0 % (ref 0.0–0.2)

## 2024-04-21 LAB — PHOSPHORUS: Phosphorus: 3.3 mg/dL (ref 2.5–4.6)

## 2024-04-21 LAB — BASIC METABOLIC PANEL WITH GFR
Anion gap: 10 (ref 5–15)
BUN: 10 mg/dL (ref 8–23)
CO2: 27 mmol/L (ref 22–32)
Calcium: 8.5 mg/dL — ABNORMAL LOW (ref 8.9–10.3)
Chloride: 105 mmol/L (ref 98–111)
Creatinine, Ser: 0.8 mg/dL (ref 0.44–1.00)
GFR, Estimated: >60 mL/min (ref >60)
Glucose, Bld: 136 mg/dL — ABNORMAL HIGH (ref 70–99)
Potassium: 3.3 mmol/L — ABNORMAL LOW (ref 3.5–5.1)
Sodium: 142 mmol/L (ref 135–145)

## 2024-04-21 LAB — TRIGLYCERIDES: Triglycerides: 117 mg/dL (ref <150)

## 2024-04-21 LAB — MAGNESIUM: Magnesium: 1.6 mg/dL — ABNORMAL LOW (ref 1.7–2.4)

## 2024-04-21 MED ORDER — SODIUM CHLORIDE 0.9% FLUSH
10.0000 mL | Freq: Two times a day (BID) | INTRAVENOUS | Status: DC
  Administered 2024-04-21 – 2024-04-26 (×11): 10 mL

## 2024-04-21 MED ORDER — CHLORHEXIDINE GLUCONATE CLOTH 2 % EX PADS
6.0000 | MEDICATED_PAD | Freq: Every day | CUTANEOUS | Status: DC
  Administered 2024-04-21 – 2024-04-26 (×6): 6 via TOPICAL

## 2024-04-21 MED ORDER — INSULIN ASPART 100 UNIT/ML IJ SOLN
0.0000 [IU] | Freq: Three times a day (TID) | INTRAMUSCULAR | Status: DC
  Administered 2024-04-22 – 2024-04-24 (×4): 1 [IU] via SUBCUTANEOUS
  Filled 2024-04-21 (×4): qty 1

## 2024-04-21 MED ORDER — DEXTROSE IN LACTATED RINGERS 5 % IV SOLN: INTRAVENOUS | Status: AC

## 2024-04-21 MED ORDER — THIAMINE HCL 100 MG/ML IJ SOLN
100.0000 mg | Freq: Every day | INTRAMUSCULAR | Status: DC
  Administered 2024-04-21 – 2024-04-26 (×5): 100 mg via INTRAVENOUS
  Filled 2024-04-21 (×6): qty 2

## 2024-04-21 MED ORDER — MAGNESIUM SULFATE 2 GM/50ML IV SOLN
2.0000 g | Freq: Once | INTRAVENOUS | Status: AC
  Administered 2024-04-21: 2 g via INTRAVENOUS
  Filled 2024-04-21: qty 50

## 2024-04-21 MED ORDER — TRAVASOL 10 % IV SOLN
INTRAVENOUS | Status: AC
  Filled 2024-04-21: qty 462

## 2024-04-21 MED ORDER — POTASSIUM CHLORIDE 10 MEQ/100ML IV SOLN
10.0000 meq | INTRAVENOUS | Status: AC
  Administered 2024-04-21 (×4): 10 meq via INTRAVENOUS
  Filled 2024-04-21: qty 100

## 2024-04-21 MED ORDER — SODIUM CHLORIDE 0.9% FLUSH: 10.0000 mL | INTRAVENOUS | Status: DC | PRN

## 2024-04-21 NOTE — Progress Notes (Signed)
 PHARMACY - TOTAL PARENTERAL NUTRITION CONSULT NOTE   Indication: Prolonged ileus  Patient Measurements: Height: 5' 1 (154.9 cm) Weight: 50.8 kg (112 lb) IBW/kg (Calculated) : 47.8 TPN AdjBW (KG): 50.8 Body mass index is 21.16 kg/m. Usual Weight: -  Assessment:  Patient is a 73 year old female with a past medical history of HTN, HLD, and GERD. She has been diagnosed with a high-grade SBO with closed-loop obstruction. Pharmacy has been consulted to initiate patient on TPN given potential delay in initiation of diet and NPO status.   Glucose / Insulin: No history of diabetes per chart review, BG 120-130s Electrolytes:  K 3.3 Mag 1.6 Phos 3.3 Renal: Scr 0.8 (baseline) Hepatic: LFTs wnl Intake / Output; MIVF: Net I/O + 6L since admit; UOP 0.9 ml/kg/hr D5LR @ 75 ml/hr GI Imaging: 10/20 CT a/p: High-grade small bowel obstruction with a closed loop obstruction.  10/21 XR abd: Persistent dilatation of small bowel loops consistent with small bowel obstruction. Not significantly changed from prior exam. 10/22 XR abd: Findings consistent with small bowel obstruction, with multiple dilated small bowel loops in the mid abdomen measuring up to 5.3 cm in diameter. Unchanged from prior exam. 10/23 XR abd: No significant change in partial small bowel obstruction.  GI Surgeries / Procedures:  10/23 exploratory laparotomy   Central access: ordered to be placed 10/24 TPN start date: 10/24  Nutritional Goals: Goal TPN rate is 70 mL/hr (provides 92.4 g of protein and 1760.64 kcals per day)  RD Assessment: Estimated Needs Total Energy Estimated Needs: 1600-1800 Total Protein Estimated Needs: 85-100 grams Total Fluid Estimated Needs: 1.6-1.8 L  Current Nutrition:  NPO  Plan:  Start TPN at 35mL/hr (1/2 goal rate) at 1800 Electrolytes in TPN: Na 86mEq/L, K 50mEq/L, Ca 43mEq/L, Mg 70mEq/L, and Phos 15mmol/L. Cl:Ac 1:1 Add standard MVI and trace elements to TPN Thiamine 100 mg IV daily x 7  days outside of TPN Supplement K and Mag outside of TPN- KCl 10 mEq IV x 4 and Mag sulfate 2 g IV x 1 Initiate Sensitive q8h SSI and adjust as needed  Reduce MIVF to 40 mL/hr at 1800 Monitor TPN labs on Mon/Thurs, will monitor electrolytes x 3 days to ensure stable given risk for re-feeding   Lum VEAR Mania, PharmD, BCPS 04/21/2024,8:50 AM

## 2024-04-21 NOTE — Progress Notes (Signed)
 Peripherally Inserted Central Catheter Placement  The IV Nurse has discussed with the patient and/or persons authorized to consent for the patient, the purpose of this procedure and the potential benefits and risks involved with this procedure.  The benefits include less needle sticks, lab draws from the catheter, and the patient may be discharged home with the catheter. Risks include, but not limited to, infection, bleeding, blood clot (thrombus formation), and puncture of an artery; nerve damage and irregular heartbeat and possibility to perform a PICC exchange if needed/ordered by physician.  Alternatives to this procedure were also discussed.  Bard Power PICC patient education guide, fact sheet on infection prevention and patient information card has been provided to patient /or left at bedside.    PICC Placement Documentation  PICC Double Lumen 04/21/24 Right Basilic 36 cm 0 cm (Active)  Indication for Insertion or Continuance of Line Administration of hyperosmolar/irritating solutions (i.e. TPN, Vancomycin, etc.) 04/21/24 1200  Exposed Catheter (cm) 0 cm 04/21/24 1200  Site Assessment Clean, Dry, Intact 04/21/24 1200  Lumen #1 Status Saline locked;Blood return noted 04/21/24 1200  Lumen #2 Status Saline locked;Blood return noted 04/21/24 1200  Dressing Type Transparent;Securing device 04/21/24 1200  Dressing Status Antimicrobial disc/dressing in place;Clean, Dry, Intact 04/21/24 1200  Line Care Connections checked and tightened 04/21/24 1200  Line Adjustment (NICU/IV Team Only) No 04/21/24 1200  Dressing Intervention New dressing 04/21/24 1200  Dressing Change Due 04/28/24 04/21/24 1200   Telephone consent obtained from daughter, [name]. Verbal consent from patient.    Kim Potter 04/21/2024, 12:11 PM

## 2024-04-21 NOTE — Progress Notes (Signed)
 Initial Nutrition Assessment  DOCUMENTATION CODES:   Non-severe (moderate) malnutrition in context of chronic illness  INTERVENTION:   -TPN management per pharmacy -Recommend MVI daily -Recommend 100 mg thiamine daily x 7 days -Monitor Mg, K, and Phos and replete as appropriate due to high refeeding risk -RD will follow for diet advancement/ ability to transition to PO diet/ enteral nutrition -Discussed nutritional needs with pharmacy -Obtain new weight to better assess weight trends  NUTRITION DIAGNOSIS:   Moderate Malnutrition related to chronic illness as evidenced by mild fat depletion, moderate fat depletion, mild muscle depletion, moderate muscle depletion.  GOAL:   Patient will meet greater than or equal to 90% of their needs  MONITOR:   Diet advancement, Other (Comment) (TPN)  REASON FOR ASSESSMENT:   Consult New TPN/TNA  ASSESSMENT:   73 y.o. African-American female with medical history significant for hypertension, dyslipidemia and GERD, who presented with acute onset of generalized abdominal pain that initially started in the epigastric area.  Pt admitted with SBO.  10/20- NGT placed (tip of tube in stomach but side port at the GE junction), removed due to malpositioning, patient refused replacement 10/21- sips and chips 10/22- gastrografin  study reveals that there is no advancement of contrast beyond the small bowel.  10/23- s/p ex lap with LOA, NGT placed (no x-ray verification)  Reviewed I/O's: +2.4 L x 24 hours and +6.1 L since admission   UOP: 1.1 L x 24 hours  Case discussed with pharmacy and general surgery. Plan for PICC placement and TPN today.   Spoke with Ms Gillum at bedside, who was pleasant and in good spirits today. She acknowledges rationale for NPO status and rationale for NGT placement for decompression. She shares that her last oral intake was on 04/16/24, which resulted in vomiting when trying to eat and promoted hospital admission.  Prior to acute illness, she reports she has a good appetite. She generally eats 2-3 meals per day and consumes soft textured foods due to past history of throat cancer.   Noted last two weights are identical. Unsure if most recent weight is a stated vs measured weight. Ms Giannotti reports that she thinks she may has lost about 5-8# over the past year. Her UBE is around 120#. She shares she has always been small framed and petite.   Rd reviewed rationale for NPO status and parameters for NGT removal and diet advancement. She was able to teachback to this RD plan for PICC placement and TPN. She reports she had had a SBO in the past, but did not require surgery or TPN for this.   Case discussed with pharmacy regarding nutritional needs and recommendations. She is at high refeeding risk due to malnutrition and prolonged NPO status.   Medications reviewed and include lovenox , protonix, dextrose 5% in lactated ringers  infusion @ 75 ml/hr.   Labs reviewed: K: 3.3, Mg: 1.6, Phos WDL,  CBGS: 93 (inpatient orders for glycemic control are none).    NUTRITION - FOCUSED PHYSICAL EXAM:  Flowsheet Row Most Recent Value  Orbital Region Mild depletion  Upper Arm Region Moderate depletion  Thoracic and Lumbar Region Mild depletion  Buccal Region Mild depletion  Temple Region Mild depletion  Clavicle Bone Region Moderate depletion  Clavicle and Acromion Bone Region Moderate depletion  Scapular Bone Region Moderate depletion  Dorsal Hand Moderate depletion  Patellar Region Mild depletion  Anterior Thigh Region Mild depletion  Posterior Calf Region Mild depletion  Edema (RD Assessment) None  Hair Reviewed  Eyes  Reviewed  Mouth Reviewed  Skin Reviewed  Nails Reviewed    Diet Order:   Diet Order             Diet NPO time specified Except for: Sips with Meds, Ice Chips  Diet effective now                   EDUCATION NEEDS:   Education needs have been addressed  Skin:  Skin Assessment: Skin  Integrity Issues: Skin Integrity Issues:: Incisions Incisions: closed abdomen  Last BM:  04/20/24 (type 4)  Height:   Ht Readings from Last 1 Encounters:  04/20/24 5' 1 (1.549 m)    Weight:   Wt Readings from Last 1 Encounters:  04/20/24 50.8 kg    Ideal Body Weight:  47.7 kg  BMI:  Body mass index is 21.16 kg/m.  Estimated Nutritional Needs:   Kcal:  1600-1800  Protein:  85-100 grams  Fluid:  1.6-1.8 L    Margery ORN, RD, LDN, CDCES Registered Dietitian III Certified Diabetes Care and Education Specialist If unable to reach this RD, please use RD Inpatient group chat on secure chat between hours of 8am-4 pm daily

## 2024-04-21 NOTE — Plan of Care (Signed)
  Problem: Clinical Measurements: Goal: Ability to maintain clinical measurements within normal limits will improve Outcome: Progressing   Problem: Pain Managment: Goal: General experience of comfort will improve and/or be controlled Outcome: Progressing   Problem: Safety: Goal: Ability to remain free from injury will improve Outcome: Progressing

## 2024-04-21 NOTE — Progress Notes (Signed)
 Report called and given to Marolyn, RN on 2C. Pt to be transferred momentarily.

## 2024-04-21 NOTE — Progress Notes (Addendum)
 PICC consult: Risks and benefits discussed with patient and daughter at bedside. She requested additional time to pray and talk to family about procedure. All questions answered. Nursing to alert this RN when pt is agreeable to PICC insertion.  Rationale for enoxaparin  and SCDs reviewed as well.

## 2024-04-21 NOTE — Progress Notes (Signed)
 [facility] SURGICAL PROGRESS NOTE  Hospital Day(s): 4.   Post op day(s): 1 Day Post-Op.   Interval History:  Patient seen and examined No acute events or new complaints overnight.  Patient reports she is doing well Abdomen pain expectedly No fever, chills, nausea Remains without leukocytosis; WBC 6.3K Hgb to 11.9; improved Renal function normal; sCr - 0.80; UO - 1050 ccs  Mild hypokalemia to 3.3, Hypomagnesemia to 1.5 NPO; plan for PICC/TPN  Vital signs in last 24 hours: [min-max] current  Temp:  [97.6 F (36.4 C)-99.8 F (37.7 C)] 99.4 F (37.4 C) (10/24 0807) Pulse Rate:  [67-112] 93 (10/24 0807) Resp:  [13-18] 16 (10/24 0807) BP: (104-157)/(74-95) 121/82 (10/24 0807) SpO2:  [98 %-100 %] 100 % (10/24 0807) Weight:  [50.8 kg] 50.8 kg (10/23 1250)     Height: 5' 1 (154.9 cm) Weight: 50.8 kg BMI (Calculated): 21.17   Intake/Output last 2 shifts:  10/23 0701 - 10/24 0700 In: 3485.5 [I.V.:3035.5; IV Piggyback:450] Out: 1100 [Urine:1050; Blood:50]   Physical Exam:  Constitutional: alert, cooperative and no distress  HEENT: NGT in place; low output Respiratory: breathing non-labored at rest  Cardiovascular: regular rate and sinus rhythm  Gastrointestinal: soft, incisional soreness, and non-distended, no rebound/guarding Genitourinary: Foley in place; good UO Integumentary: Laparotomy is CDI with staples and honeycomb, no significant drainage   Labs:     Latest Ref Rng & Units 04/21/2024    5:02 AM 04/20/2024    3:44 AM 04/18/2024    4:21 AM  CBC  WBC 4.0 - 10.5 K/uL 6.3  6.9  5.4   Hemoglobin 12.0 - 15.0 g/dL 88.0  89.3  88.9   Hematocrit 36.0 - 46.0 % 35.9  32.0  33.6   Platelets 150 - 400 K/uL 191  160  153       Latest Ref Rng & Units 04/21/2024    5:02 AM 04/20/2024    3:44 AM 04/18/2024    4:21 AM  CMP  Glucose 70 - 99 mg/dL 863  875  91   BUN 8 - 23 mg/dL 10  8  15    Creatinine 0.44 - 1.00 mg/dL 9.19  9.39  9.22   Sodium 135 - 145  mmol/L 142  141  141   Potassium 3.5 - 5.1 mmol/L 3.3  3.4  3.6   Chloride 98 - 111 mmol/L 105  105  106   CO2 22 - 32 mmol/L 27  28  28    Calcium 8.9 - 10.3 mg/dL 8.5  8.8  8.4      Imaging studies: No new pertinent imaging studies   Assessment/Plan:  73 y.o. female 1 Day Post-Op s/p exploratory laparotomy and LOA for small bowel obstruction   - Agree with plan for PICC/TPN today. She has been NPO this admission and anticipate delay in initiation of diet  - Continue NGT decompression; LIS; monitor and record output  - We can discontinue foley catheter  - Monitor abdominal examination; on-going bowel function   - Serial KUB as needed  - Nutritional labs daily while initiating TPN  - Pain control prn; antiemetics prn  - Mobilize; okay to clamp NGT to allow this; okay for therapies  - I will put in patient transfer request for private room given recent surgery and anticipated prolonged admission  - Further management per primary service; we will follow   All of the above findings and recommendations were discussed with the patient, and the medical team, and all of  patient's questions were answered to her expressed satisfaction.  -- Arthea Platt, PA-C [facility] 04/21/2024, 8:41 AM M-F: 7am - 4pm

## 2024-04-21 NOTE — Progress Notes (Signed)
 Progress Note   Patient: Kim Potter FMW:[id number] DOB: [birth year] DOA: 04/17/2024     4 DOS: the patient was seen and examined on 04/21/2024     Brief hospital course:    Kim Potter is a 73 y.o. African-American female with medical history significant for hypertension, dyslipidemia and GERD, who presented to the emergency room with abdominal pain.  Patient found to have high-grade small bowel obstruction with closed-loop obstruction.  General surgery following   Consultants:  General surgery    Procedures/Surgeries: none   ASSESSMENT & PLAN:   SBO (small bowel obstruction)  Patient is status post exploratory laparotomy with lysis of adhesion done by general surgeon on 04/20/2024 Still n.p.o. according to general surgery General surgery following  Pain management  IV fluids    Essential hypertension as needed IV hydralazine. Restarted po amlodipine  and metoprolol  today  BP has been at goal, hold hydrochlorothiazide    Dyslipidemia Statin.   GERD without esophagitis IV PPI therapy   Allergies restart po meds fexofenadine, montelukast     DVT prophylaxis: lovenox  IV fluids: LR w/ dextrose continuous IV fluids until able to take po      TOC needs: TBD   Subjective Patient still remains n.p.o. Postop day 1 Denies nausea vomiting abdominal pain chest pain cough   Family Communication: none at this time      Physical Exam Constitutional:      General: She is not in acute distress. Cardiovascular:     Rate and Rhythm: Normal rate and regular rhythm.  Abdominal:     General: Bowel sounds are increased.     Palpations: Abdomen is soft.     Tenderness: There is no abdominal tenderness.  Neurological:     Mental Status: She is alert and oriented to person, place, and time.  Psychiatric:        Mood and Affect: Mood normal.        Behavior: Behavior normal.       Data Reviewed:     Latest Ref Rng & Units 04/21/2024    5:02 AM 04/20/2024    3:44 AM  04/18/2024    4:21 AM  CBC  WBC 4.0 - 10.5 K/uL 6.3  6.9  5.4   Hemoglobin 12.0 - 15.0 g/dL 88.0  89.3  88.9   Hematocrit 36.0 - 46.0 % 35.9  32.0  33.6   Platelets 150 - 400 K/uL 191  160  153        Latest Ref Rng & Units 04/21/2024    5:02 AM 04/20/2024    3:44 AM 04/18/2024    4:21 AM  BMP  Glucose 70 - 99 mg/dL 863  875  91   BUN 8 - 23 mg/dL 10  8  15    Creatinine 0.44 - 1.00 mg/dL 9.19  9.39  9.22   Sodium 135 - 145 mmol/L 142  141  141   Potassium 3.5 - 5.1 mmol/L 3.3  3.4  3.6   Chloride 98 - 111 mmol/L 105  105  106   CO2 22 - 32 mmol/L 27  28  28    Calcium 8.9 - 10.3 mg/dL 8.5  8.8  8.4       Vitals:   04/21/24 1240 04/21/24 1524 04/21/24 1620 04/21/24 1656  BP: 117/72 121/69    Pulse: 86 (!) 105    Resp: 18 18    Temp: 98.1 F (36.7 C) (!) 100.4 F (38 C) (!) 100.4 F (38 C) 99.9  F (37.7 C)  TempSrc: Oral Oral Oral Oral  SpO2: 100% 99%    Weight:      Height:        Author: Drue ONEIDA Potter, MD 04/21/2024 5:05 PM  For on call review [email or web address].

## 2024-04-21 NOTE — Plan of Care (Addendum)
 Approximately 1245-- Pt arrived to room 223 from 1A. Upon arrival, VS obtained and full assessment completed. Fall precautions in place. Pt accompanied by her daughter, at bedside.   Problem: Education: Goal: Knowledge of General Education information will improve Description: Including pain rating scale, medication(s)/side effects and non-pharmacologic comfort measures Outcome: Progressing   Problem: Health Behavior/Discharge Planning: Goal: Ability to manage health-related needs will improve Outcome: Progressing   Problem: Clinical Measurements: Goal: Ability to maintain clinical measurements within normal limits will improve Outcome: Progressing Goal: Will remain free from infection Outcome: Progressing Goal: Diagnostic test results will improve Outcome: Progressing

## 2024-04-22 ENCOUNTER — Inpatient Hospital Stay

## 2024-04-22 DIAGNOSIS — I700 Atherosclerosis of aorta: Secondary | ICD-10-CM | POA: Diagnosis not present

## 2024-04-22 DIAGNOSIS — K5989 Other specified functional intestinal disorders: Secondary | ICD-10-CM | POA: Diagnosis not present

## 2024-04-22 DIAGNOSIS — K56609 Unspecified intestinal obstruction, unspecified as to partial versus complete obstruction: Secondary | ICD-10-CM | POA: Diagnosis not present

## 2024-04-22 LAB — CBC WITH DIFFERENTIAL/PLATELET
Abs Immature Granulocytes: 0.03 K/uL (ref 0.00–0.07)
Basophils Absolute: 0 K/uL (ref 0.0–0.1)
Basophils Relative: 0 %
Eosinophils Absolute: 0 K/uL (ref 0.0–0.5)
Eosinophils Relative: 0 %
HCT: 30 % — ABNORMAL LOW (ref 36.0–46.0)
Hemoglobin: 10 g/dL — ABNORMAL LOW (ref 12.0–15.0)
Immature Granulocytes: 1 %
Lymphocytes Relative: 4 %
Lymphs Abs: 0.2 K/uL — ABNORMAL LOW (ref 0.7–4.0)
MCH: 23.1 pg — ABNORMAL LOW (ref 26.0–34.0)
MCHC: 33.3 g/dL (ref 30.0–36.0)
MCV: 69.4 fL — ABNORMAL LOW (ref 80.0–100.0)
Monocytes Absolute: 0.3 K/uL (ref 0.1–1.0)
Monocytes Relative: 4 %
Neutro Abs: 5.5 K/uL (ref 1.7–7.7)
Neutrophils Relative %: 91 %
Platelets: 148 K/uL — ABNORMAL LOW (ref 150–400)
RBC: 4.32 MIL/uL (ref 3.87–5.11)
RDW: 15.7 % — ABNORMAL HIGH (ref 11.5–15.5)
Smear Review: NORMAL
WBC: 6.1 K/uL (ref 4.0–10.5)
nRBC: 0 % (ref 0.0–0.2)

## 2024-04-22 LAB — GLUCOSE, CAPILLARY
Glucose-Capillary: 121 mg/dL — ABNORMAL HIGH (ref 70–99)
Glucose-Capillary: 105 mg/dL — ABNORMAL HIGH (ref 70–99)
Glucose-Capillary: 126 mg/dL — ABNORMAL HIGH (ref 70–99)
Glucose-Capillary: 133 mg/dL — ABNORMAL HIGH (ref 70–99)

## 2024-04-22 LAB — BASIC METABOLIC PANEL WITH GFR
Anion gap: 12 (ref 5–15)
BUN: 14 mg/dL (ref 8–23)
CO2: 27 mmol/L (ref 22–32)
Calcium: 8.2 mg/dL — ABNORMAL LOW (ref 8.9–10.3)
Chloride: 101 mmol/L (ref 98–111)
Creatinine, Ser: 0.57 mg/dL (ref 0.44–1.00)
GFR, Estimated: >60 mL/min (ref >60)
Glucose, Bld: 108 mg/dL — ABNORMAL HIGH (ref 70–99)
Potassium: 3.5 mmol/L (ref 3.5–5.1)
Sodium: 140 mmol/L (ref 135–145)

## 2024-04-22 LAB — MAGNESIUM: Magnesium: 2 mg/dL (ref 1.7–2.4)

## 2024-04-22 LAB — PHOSPHORUS: Phosphorus: 2 mg/dL — ABNORMAL LOW (ref 2.5–4.6)

## 2024-04-22 MED ORDER — TRAVASOL 10 % IV SOLN
INTRAVENOUS | Status: AC
  Filled 2024-04-22: qty 924

## 2024-04-22 MED ORDER — POTASSIUM PHOSPHATES 15 MMOLE/5ML IV SOLN
30.0000 mmol | Freq: Once | INTRAVENOUS | Status: AC
  Administered 2024-04-22: 30 mmol via INTRAVENOUS
  Filled 2024-04-22: qty 10

## 2024-04-22 NOTE — Progress Notes (Signed)
 Progress Note   Patient: Kim Potter FMW:[id number] DOB: 1950/07/24 DOA: 04/17/2024     5 DOS: the patient was seen and examined on 04/22/2024    Brief hospital course:    Kim Potter is a 73 y.o. African-American female with medical history significant for hypertension, dyslipidemia and GERD, who presented to the emergency room with abdominal pain.  Patient found to have high-grade small bowel obstruction with closed-loop obstruction.  General surgery following   Consultants:  General surgery    Procedures/Surgeries: none   ASSESSMENT & PLAN:   SBO (small bowel obstruction)  Patient is status post exploratory laparotomy with lysis of adhesion done by general surgeon on 04/20/2024 Still n.p.o. according to general surgery General surgery following  Pain management  Patient initiated on TPN   Essential hypertension as needed IV hydralazine. Restarted po amlodipine  and metoprolol  today  BP has been at goal, hold hydrochlorothiazide    Dyslipidemia Statin.   GERD without esophagitis IV PPI therapy   Allergies restart po meds fexofenadine, montelukast     DVT prophylaxis: lovenox  IV fluids: LR w/ dextrose continuous IV fluids until able to take po      TOC needs: TBD   Subjective Denies nausea vomiting abdominal pain chest pain cough Tolerating TPN well   Family Communication: none at this time      Physical Exam Constitutional:      General: She is not in acute distress. Cardiovascular:     Rate and Rhythm: Normal rate and regular rhythm.  Abdominal:     General: Bowel sounds are increased.     Palpations: Abdomen is soft.     Tenderness: There is no abdominal tenderness.  Neurological:     Mental Status: She is alert and oriented to person, place, and time.  Psychiatric:        Mood and Affect: Mood normal.        Behavior: Behavior normal.       Data Reviewed:      Latest Ref Rng & Units 04/22/2024    5:11 AM 04/21/2024    5:02 AM  04/20/2024    3:44 AM  CBC  WBC 4.0 - 10.5 K/uL 6.1  6.3  6.9   Hemoglobin 12.0 - 15.0 g/dL 89.9  88.0  89.3   Hematocrit 36.0 - 46.0 % 30.0  35.9  32.0   Platelets 150 - 400 K/uL 148  191  160        Latest Ref Rng & Units 04/22/2024    5:11 AM 04/21/2024    5:02 AM 04/20/2024    3:44 AM  BMP  Glucose 70 - 99 mg/dL 891  863  875   BUN 8 - 23 mg/dL 14  10  8    Creatinine 0.44 - 1.00 mg/dL 9.42  9.19  9.39   Sodium 135 - 145 mmol/L 140  142  141   Potassium 3.5 - 5.1 mmol/L 3.5  3.3  3.4   Chloride 98 - 111 mmol/L 101  105  105   CO2 22 - 32 mmol/L 27  27  28    Calcium 8.9 - 10.3 mg/dL 8.2  8.5  8.8      Vitals:   04/22/24 0140 04/22/24 0335 04/22/24 0812 04/22/24 1533  BP: 123/68  128/81 137/84  Pulse: (!) 101  (!) 108 (!) 101  Resp: 17  16 16   Temp: 99.9 F (37.7 C)  98.9 F (37.2 C) 100.3 F (37.9 C)  TempSrc: Oral  SpO2: 99%  100% 100%  Weight:  51.5 kg    Height:         Author: Drue ONEIDA Potter, MD 04/22/2024 5:10 PM  For on call review [email or web address].

## 2024-04-22 NOTE — Plan of Care (Signed)
   Problem: Clinical Measurements: Goal: Ability to maintain clinical measurements within normal limits will improve Outcome: Progressing   Problem: Clinical Measurements: Goal: Will remain free from infection Outcome: Progressing

## 2024-04-22 NOTE — Progress Notes (Signed)
 04/22/2024  Subjective: Patient is 2 Days Post-Op.  No acute events overnight.  Patient reports no flatus or bowel movement yet.  NG output was low.  She has been started on TPN which started yesterday.  Vital signs: Temp:  [98.9 F (37.2 C)-100.4 F (38 C)] 98.9 F (37.2 C) (10/25 0812) Pulse Rate:  [101-108] 108 (10/25 0812) Resp:  [16-18] 16 (10/25 0812) BP: (121-135)/(68-81) 128/81 (10/25 0812) SpO2:  [99 %-100 %] 100 % (10/25 0812) Weight:  [51.5 kg] 51.5 kg (10/25 0335)   Intake/Output: 10/24 0701 - 10/25 0700 In: 1517.2 [I.V.:1081.5; IV Piggyback:435.7] Out: 55 [Emesis/NG output:55] Last BM Date : 04/20/24  Physical Exam: Constitutional: No acute distress Abdomen: Soft, nondistended, appropriately tender to palpation.  Midline incision is clean, dry, intact with staples in place.  Labs:  Recent Labs    04/21/24 0502 04/22/24 0511  WBC 6.3 6.1  HGB 11.9* 10.0*  HCT 35.9* 30.0*  PLT 191 148*   Recent Labs    04/21/24 0502 04/22/24 0511  NA 142 140  K 3.3* 3.5  CL 105 101  CO2 27 27  GLUCOSE 136* 108*  BUN 10 14  CREATININE 0.80 0.57  CALCIUM 8.5* 8.2*   No results for input(s): LABPROT, INR in the last 72 hours.  Imaging: DG ABD ACUTE 2+V W 1V CHEST Result Date: 04/22/2024 EXAM: UPRIGHT AND SUPINE XRAY VIEWS OF THE ABDOMEN AND 4 VIEW(S) OF THE CHEST 04/22/2024 04:45:00 AM COMPARISON: 04/20/2024 CLINICAL HISTORY: FINDINGS: LUNGS AND PLEURA: Retrocardiac airspace opacity likely due to atelectasis. No consolidation or pulmonary edema. No pleural effusion or pneumothorax. HEART AND MEDIASTINUM: Right upper extremity PICC with tip terminating over the superior cavoatrial junction. Aortic atherosclerosis. No acute abnormality of the cardiac and mediastinal silhouettes. BOWEL: Enteric tube with tip and side port overlying the expected region of the gastric lumen. Improved small bowel dilatation within the mid abdomen. No bowel obstruction. PERITONEUM AND SOFT  TISSUES: Midline surgical staples. Left upper quadrant surgical clips. No abnormal calcifications. No free air. BONES: No acute osseous abnormality. IMPRESSION: 1. Improved small bowel dilatation within the mid abdomen. Electronically signed by: Waddell Calk MD 04/22/2024 05:30 AM EDT RP Workstation: [id number]    Assessment/Plan: This is a 73 y.o. female s/p exploratory laparotomy lysis of adhesions.  - Patient is doing well after surgery.  Discussed with her that we are just awaiting return of bowel function prior to being able to clamp her NG tube and remove it.  In the meantime, she will remain n.p.o., with NG tube to suction.  Will continue TPN today. - Out of bed and ambulate as tolerated. - Will continue to follow with you.   Aloysius Sheree Plant, MD [facility]

## 2024-04-22 NOTE — Progress Notes (Signed)
 PHARMACY - TOTAL PARENTERAL NUTRITION CONSULT NOTE   Indication: Prolonged ileus  Patient Measurements: Height: 5' 1 (154.9 cm) Weight: 51.5 kg (113 lb 8.6 oz) IBW/kg (Calculated) : 47.8 TPN AdjBW (KG): 50.8 Body mass index is 21.45 kg/m. Usual Weight: -  Assessment:  Patient is a 73 year old female with a past medical history of HTN, HLD, and GERD. She has been diagnosed with a high-grade SBO with closed-loop obstruction. Pharmacy has been consulted to initiate patient on TPN given potential delay in initiation of diet and NPO status.   Glucose / Insulin: No history of diabetes per chart review, BG 120-130s Electrolytes:  K 3.3 Mag 1.6 Phos 3.3 Renal: Scr 0.8 (baseline) Hepatic: LFTs wnl Intake / Output; MIVF: Net I/O + 6L since admit; UOP 0.9 ml/kg/hr D5LR @ 75 ml/hr GI Imaging: 10/20 CT a/p: High-grade small bowel obstruction with a closed loop obstruction.  10/21 XR abd: Persistent dilatation of small bowel loops consistent with small bowel obstruction. Not significantly changed from prior exam. 10/22 XR abd: Findings consistent with small bowel obstruction, with multiple dilated small bowel loops in the mid abdomen measuring up to 5.3 cm in diameter. Unchanged from prior exam. 10/23 XR abd: No significant change in partial small bowel obstruction.  GI Surgeries / Procedures:  10/23 exploratory laparotomy   Central access: ordered to be placed 10/24 TPN start date: 10/24  Nutritional Goals: Goal TPN rate is 70 mL/hr (provides 92.4 g of protein and 1760.64 kcals per day)  RD Assessment: Estimated Needs Total Energy Estimated Needs: 1600-1800 Total Protein Estimated Needs: 85-100 grams Total Fluid Estimated Needs: 1.6-1.8 L  Current Nutrition:  NPO  Plan:  Increase TPN to goal rate of 20mL/hr at 1800 Electrolytes in TPN: Na 91mEq/L, K 38mEq/L, Ca 9mEq/L, Mg 43mEq/L, and Phos 15mmol/L. Cl:Ac 1:1 Add standard MVI and trace elements to TPN Thiamine 100 mg IV  daily x 7 days outside of TPN Supplement K and Phos outside of TPN - Kphos 30mmol IV x 1 Continue Sensitive q8h SSI and adjust as needed  Will stop IVF this evening once goal rate of TPN is initiated Monitor TPN labs on Mon/Thurs, will monitor electrolytes x 3 days to ensure stable given risk for re-feeding   Kim Potter, PharmD Clinical Pharmacist 04/22/2024 8:47 AM

## 2024-04-23 DIAGNOSIS — K56609 Unspecified intestinal obstruction, unspecified as to partial versus complete obstruction: Secondary | ICD-10-CM | POA: Diagnosis not present

## 2024-04-23 LAB — CBC WITH DIFFERENTIAL/PLATELET
Abs Immature Granulocytes: 0.05 K/uL (ref 0.00–0.07)
Basophils Absolute: 0 K/uL (ref 0.0–0.1)
Basophils Relative: 0 %
Eosinophils Absolute: 0 K/uL (ref 0.0–0.5)
Eosinophils Relative: 0 %
HCT: 28.4 % — ABNORMAL LOW (ref 36.0–46.0)
Hemoglobin: 9.4 g/dL — ABNORMAL LOW (ref 12.0–15.0)
Immature Granulocytes: 1 %
Lymphocytes Relative: 3 %
Lymphs Abs: 0.2 K/uL — ABNORMAL LOW (ref 0.7–4.0)
MCH: 22.8 pg — ABNORMAL LOW (ref 26.0–34.0)
MCHC: 33.1 g/dL (ref 30.0–36.0)
MCV: 68.8 fL — ABNORMAL LOW (ref 80.0–100.0)
Monocytes Absolute: 0.3 K/uL (ref 0.1–1.0)
Monocytes Relative: 5 %
Neutro Abs: 6.3 K/uL (ref 1.7–7.7)
Neutrophils Relative %: 91 %
Platelets: 147 K/uL — ABNORMAL LOW (ref 150–400)
RBC: 4.13 MIL/uL (ref 3.87–5.11)
RDW: 15.7 % — ABNORMAL HIGH (ref 11.5–15.5)
Smear Review: NORMAL
WBC: 6.9 K/uL (ref 4.0–10.5)
nRBC: 0 % (ref 0.0–0.2)

## 2024-04-23 LAB — BASIC METABOLIC PANEL WITH GFR
Anion gap: 7 (ref 5–15)
BUN: 17 mg/dL (ref 8–23)
CO2: 27 mmol/L (ref 22–32)
Calcium: 8.1 mg/dL — ABNORMAL LOW (ref 8.9–10.3)
Chloride: 103 mmol/L (ref 98–111)
Creatinine, Ser: 0.58 mg/dL (ref 0.44–1.00)
GFR, Estimated: >60 mL/min (ref >60)
Glucose, Bld: 104 mg/dL — ABNORMAL HIGH (ref 70–99)
Potassium: 3.9 mmol/L (ref 3.5–5.1)
Sodium: 137 mmol/L (ref 135–145)

## 2024-04-23 LAB — GLUCOSE, CAPILLARY
Glucose-Capillary: 120 mg/dL — ABNORMAL HIGH (ref 70–99)
Glucose-Capillary: 116 mg/dL — ABNORMAL HIGH (ref 70–99)

## 2024-04-23 LAB — PHOSPHORUS: Phosphorus: 3 mg/dL (ref 2.5–4.6)

## 2024-04-23 LAB — MAGNESIUM: Magnesium: 1.9 mg/dL (ref 1.7–2.4)

## 2024-04-23 MED ORDER — GUAIFENESIN-DM 100-10 MG/5ML PO SYRP
5.0000 mL | ORAL_SOLUTION | ORAL | Status: DC | PRN
  Administered 2024-04-23 (×3): 5 mL via ORAL
  Filled 2024-04-23 (×3): qty 10

## 2024-04-23 MED ORDER — TRAVASOL 10 % IV SOLN
INTRAVENOUS | Status: AC
  Filled 2024-04-23: qty 924

## 2024-04-23 NOTE — Progress Notes (Signed)
 04/23/2024  Subjective: Patient is 3 Days Post-Op.  No acute events overnight.  Patient reports no flatus or bowel movement yet but does feel rumbling in her abdomen.  NG output remains low.  Vital signs: Temp:  [98.6 F (37 C)-100.3 F (37.9 C)] 99.1 F (37.3 C) (10/26 0814) Pulse Rate:  [101-108] 103 (10/26 0814) Resp:  [16-18] 18 (10/26 0814) BP: (116-137)/(75-85) 135/85 (10/26 0814) SpO2:  [100 %] 100 % (10/26 0814) Weight:  [51.9 kg] 51.9 kg (10/26 0418)   Intake/Output: 10/25 0701 - 10/26 0700 In: 2056.9 [P.O.:80; I.V.:1535.2; NG/GT:30; IV Piggyback:411.6] Out: 1735 [Urine:1600; Emesis/NG output:135] Last BM Date : 04/20/24  Physical Exam: Constitutional: No acute distress Abdomen: Soft, nondistended, appropriately tender to palpation.  Midline incision is clean, dry, intact with staples in place.  Labs:  Recent Labs    04/22/24 0511 04/23/24 0550  WBC 6.1 6.9  HGB 10.0* 9.4*  HCT 30.0* 28.4*  PLT 148* 147*   Recent Labs    04/22/24 0511 04/23/24 0550  NA 140 137  K 3.5 3.9  CL 101 103  CO2 27 27  GLUCOSE 108* 104*  BUN 14 17  CREATININE 0.57 0.58  CALCIUM 8.2* 8.1*   No results for input(s): LABPROT, INR in the last 72 hours.  Imaging: No results found.   Assessment/Plan: This is a 73 y.o. female s/p exploratory laparotomy lysis of adhesions.  - Patient continues to do well.  Awaiting return of bowel function.  NG output has been low so hopefully bowel function will start soon. - For now continue n.p.o., NG tube to suction, and TPN for nutrition. - Out of bed and ambulate as tolerated. - Will continue to follow with you.   Aloysius Sheree Plant, MD  [facility]

## 2024-04-23 NOTE — Plan of Care (Signed)
  Problem: Clinical Measurements: Goal: Ability to maintain clinical measurements within normal limits will improve Outcome: Progressing   Problem: Clinical Measurements: Goal: Will remain free from infection Outcome: Progressing   Problem: Activity: Goal: Risk for activity intolerance will decrease Outcome: Progressing   Problem: Nutrition: Goal: Adequate nutrition will be maintained Outcome: Progressing   Problem: Elimination: Goal: Will not experience complications related to bowel motility Outcome: Progressing

## 2024-04-23 NOTE — Progress Notes (Signed)
 Progress Note   Patient: Kim Potter FMW:[id number] DOB: Jul 16, 1950 DOA: 04/17/2024     6 DOS: the patient was seen and examined on 04/23/2024     Brief hospital course:    Kim Potter is a 73 y.o. African-American female with medical history significant for hypertension, dyslipidemia and GERD, who presented to the emergency room with abdominal pain.  Patient found to have high-grade small bowel obstruction with closed-loop obstruction.  General surgery following   Consultants:  General surgery    Procedures/Surgeries: none   ASSESSMENT & PLAN:   SBO (small bowel obstruction)  Patient is status post exploratory laparotomy with lysis of adhesion done by general surgeon on 04/20/2024 Still n.p.o. according to general surgery Continue NG tube General surgery following  Pain management  Patient initiated on TPN Continue to monitor bowel function   Essential hypertension as needed IV hydralazine. Restarted po amlodipine  and metoprolol  today  BP has been at goal, hold hydrochlorothiazide    Dyslipidemia Statin.   GERD without esophagitis IV PPI therapy   Allergies restart po meds fexofenadine, montelukast     DVT prophylaxis: lovenox  IV fluids: LR w/ dextrose continuous IV fluids until able to take po      TOC needs: TBD   Subjective Denies nausea vomiting abdominal pain chest pain cough Tolerating TPN well NG tube in place   Family Communication: none at this time      Physical Exam Constitutional:      General: She is not in acute distress. Cardiovascular:     Rate and Rhythm: Normal rate and regular rhythm.  Abdominal:     General: Bowel sounds are increased.     Palpations: Abdomen is soft.     Tenderness: There is no abdominal tenderness.  Neurological:     Mental Status: She is alert and oriented to person, place, and time.  Psychiatric:        Mood and Affect: Mood normal.        Behavior: Behavior normal.       Data Reviewed:      Vitals:   04/22/24 2010 04/23/24 0347 04/23/24 0418 04/23/24 0814  BP: 131/80 116/75  135/85  Pulse: (!) 108 (!) 106  (!) 103  Resp: 17 18  18   Temp: 99.8 F (37.7 C) 98.6 F (37 C)  99.1 F (37.3 C)  TempSrc:      SpO2: 100% 100%  100%  Weight:   51.9 kg   Height:          Latest Ref Rng & Units 04/23/2024    5:50 AM 04/22/2024    5:11 AM 04/21/2024    5:02 AM  CBC  WBC 4.0 - 10.5 K/uL 6.9  6.1  6.3   Hemoglobin 12.0 - 15.0 g/dL 9.4  89.9  88.0   Hematocrit 36.0 - 46.0 % 28.4  30.0  35.9   Platelets 150 - 400 K/uL 147  148  191        Latest Ref Rng & Units 04/23/2024    5:50 AM 04/22/2024    5:11 AM 04/21/2024    5:02 AM  BMP  Glucose 70 - 99 mg/dL 895  891  863   BUN 8 - 23 mg/dL 17  14  10    Creatinine 0.44 - 1.00 mg/dL 9.41  9.42  9.19   Sodium 135 - 145 mmol/L 137  140  142   Potassium 3.5 - 5.1 mmol/L 3.9  3.5  3.3   Chloride  98 - 111 mmol/L 103  101  105   CO2 22 - 32 mmol/L 27  27  27    Calcium 8.9 - 10.3 mg/dL 8.1  8.2  8.5      Author: Drue ONEIDA Potter, MD 04/23/2024 9:04 AM  For on call review [email or web address].

## 2024-04-23 NOTE — Progress Notes (Signed)
 PHARMACY - TOTAL PARENTERAL NUTRITION CONSULT NOTE   Indication: Prolonged ileus  Patient Measurements: Height: 5' 1 (154.9 cm) Weight: 51.9 kg (114 lb 6.7 oz) IBW/kg (Calculated) : 47.8 TPN AdjBW (KG): 50.8 Body mass index is 21.62 kg/m. Usual Weight: -  Assessment:  Patient is a 73 year old female with a past medical history of HTN, HLD, and GERD. She has been diagnosed with a high-grade SBO with closed-loop obstruction. Pharmacy has been consulted to initiate patient on TPN given potential delay in initiation of diet and NPO status.   Glucose / Insulin: No history of diabetes per chart review, BG 120-130s Electrolytes:  K 3.9 Mag 1.9 Phos 3.0 Renal: Scr 0.8 (baseline) Hepatic: LFTs wnl Intake / Output; MIVF: Net I/O + 7.8L since admit(+1L in last 24hr) GI Imaging: 10/20 CT a/p: High-grade small bowel obstruction with a closed loop obstruction.  10/21 XR abd: Persistent dilatation of small bowel loops consistent with small bowel obstruction. Not significantly changed from prior exam. 10/22 XR abd: Findings consistent with small bowel obstruction, with multiple dilated small bowel loops in the mid abdomen measuring up to 5.3 cm in diameter. Unchanged from prior exam. 10/23 XR abd: No significant change in partial small bowel obstruction.  GI Surgeries / Procedures:  10/23 exploratory laparotomy   Central access: ordered to be placed 10/24 TPN start date: 10/24  Nutritional Goals: Goal TPN rate is 70 mL/hr (provides 92.4 g of protein and 1760.64 kcals per day)  RD Assessment: Estimated Needs Total Energy Estimated Needs: 1600-1800 Total Protein Estimated Needs: 85-100 grams Total Fluid Estimated Needs: 1.6-1.8 L  Current Nutrition:  NPO  Plan:  Continue TPN at goal rate of 92mL/hr at 1800 Electrolytes in TPN: Na 75mEq/L, K 78mEq/L(increased on 10/26), Ca 45mEq/L, Mg 63mEq/L(increased on 10/26), and Phos 21mmol/L. Cl:Ac 1:1 Add standard MVI and trace elements to  TPN Thiamine 100 mg IV daily x 7 days outside of TPN Continue Sensitive q8h SSI and adjust as needed  Monitor TPN labs on Mon/Thurs, will monitor electrolytes x 3 days to ensure stable given risk for re-feeding   Madolyn Ackroyd A Lenise Jr, PharmD Clinical Pharmacist 04/23/2024 12:51 PM

## 2024-04-24 DIAGNOSIS — K56609 Unspecified intestinal obstruction, unspecified as to partial versus complete obstruction: Secondary | ICD-10-CM | POA: Diagnosis not present

## 2024-04-24 LAB — COMPREHENSIVE METABOLIC PANEL WITH GFR
ALT: 11 U/L (ref 0–44)
AST: 18 U/L (ref 15–41)
Albumin: 2.8 g/dL — ABNORMAL LOW (ref 3.5–5.0)
Alkaline Phosphatase: 40 U/L (ref 38–126)
Anion gap: 7 (ref 5–15)
BUN: 19 mg/dL (ref 8–23)
CO2: 27 mmol/L (ref 22–32)
Calcium: 8.4 mg/dL — ABNORMAL LOW (ref 8.9–10.3)
Chloride: 101 mmol/L (ref 98–111)
Creatinine, Ser: 0.36 mg/dL — ABNORMAL LOW (ref 0.44–1.00)
GFR, Estimated: >60 mL/min (ref >60)
Glucose, Bld: 104 mg/dL — ABNORMAL HIGH (ref 70–99)
Potassium: 4.4 mmol/L (ref 3.5–5.1)
Sodium: 135 mmol/L (ref 135–145)
Total Bilirubin: 0.5 mg/dL (ref 0.0–1.2)
Total Protein: 5.8 g/dL — ABNORMAL LOW (ref 6.5–8.1)

## 2024-04-24 LAB — CBC WITH DIFFERENTIAL/PLATELET
Abs Immature Granulocytes: 0.07 K/uL (ref 0.00–0.07)
Basophils Absolute: 0 K/uL (ref 0.0–0.1)
Basophils Relative: 0 %
Eosinophils Absolute: 0.1 K/uL (ref 0.0–0.5)
Eosinophils Relative: 1 %
HCT: 28.5 % — ABNORMAL LOW (ref 36.0–46.0)
Hemoglobin: 9.4 g/dL — ABNORMAL LOW (ref 12.0–15.0)
Immature Granulocytes: 1 %
Lymphocytes Relative: 4 %
Lymphs Abs: 0.3 K/uL — ABNORMAL LOW (ref 0.7–4.0)
MCH: 22.7 pg — ABNORMAL LOW (ref 26.0–34.0)
MCHC: 33 g/dL (ref 30.0–36.0)
MCV: 68.8 fL — ABNORMAL LOW (ref 80.0–100.0)
Monocytes Absolute: 0.3 K/uL (ref 0.1–1.0)
Monocytes Relative: 6 %
Neutro Abs: 5.3 K/uL (ref 1.7–7.7)
Neutrophils Relative %: 88 %
Platelets: 161 K/uL (ref 150–400)
RBC: 4.14 MIL/uL (ref 3.87–5.11)
RDW: 15.2 % (ref 11.5–15.5)
WBC: 6 K/uL (ref 4.0–10.5)
nRBC: 0 % (ref 0.0–0.2)

## 2024-04-24 LAB — GLUCOSE, CAPILLARY
Glucose-Capillary: 106 mg/dL — ABNORMAL HIGH (ref 70–99)
Glucose-Capillary: 116 mg/dL — ABNORMAL HIGH (ref 70–99)
Glucose-Capillary: 120 mg/dL — ABNORMAL HIGH (ref 70–99)
Glucose-Capillary: 124 mg/dL — ABNORMAL HIGH (ref 70–99)

## 2024-04-24 LAB — PHOSPHORUS: Phosphorus: 3.3 mg/dL (ref 2.5–4.6)

## 2024-04-24 LAB — TRIGLYCERIDES: Triglycerides: 141 mg/dL (ref <150)

## 2024-04-24 LAB — MAGNESIUM: Magnesium: 2 mg/dL (ref 1.7–2.4)

## 2024-04-24 MED ORDER — FAMOTIDINE 20 MG PO TABS
40.0000 mg | ORAL_TABLET | Freq: Every day | ORAL | Status: DC
  Administered 2024-04-25 – 2024-04-26 (×2): 40 mg via ORAL
  Filled 2024-04-24 (×2): qty 2

## 2024-04-24 MED ORDER — TRAVASOL 10 % IV SOLN
INTRAVENOUS | Status: AC
  Filled 2024-04-24: qty 924

## 2024-04-24 NOTE — Progress Notes (Signed)
 Mobility Specialist - Progress Note   04/24/24 1225  Mobility  Activity Ambulated with assistance  Level of Assistance Standby assist, set-up cues, supervision of patient - no hands on  Assistive Device Front wheel walker;None  Distance Ambulated (ft) 640 ft  Activity Response Tolerated well  Mobility visit 1 Mobility  Mobility Specialist Start Time (ACUTE ONLY) 1144  Mobility Specialist Stop Time (ACUTE ONLY) 1200  Mobility Specialist Time Calculation (min) (ACUTE ONLY) 16 min   Pt supine upon entry, utilizing RA. Pt transferred to/from the [facility] and amb 4 laps around the NS (2 without RW)-- increased speed without AD. Pt returned to the room, left semi fowler with alarm set and needs within reach.  America Silvan Mobility Specialist 04/24/24 12:37 PM

## 2024-04-24 NOTE — Plan of Care (Signed)

## 2024-04-24 NOTE — Progress Notes (Signed)
 [facility] SURGICAL PROGRESS NOTE  Hospital Day(s): 7.   Post op day(s): 4 Days Post-Op.   Interval History:  Patient seen and examined No acute events or new complaints overnight.  Patient reports she is feeling good Abdomen is sore; manageable  No fever, chills, nausea, emesis She is without leukocytosis; WBC 6.0K Hgb to 9.4; stable Renal function normal; sCr - 0.36; UO - unmeasured No electrolyte derangements NGT with 125 ccs out; dilute She has had a BM Ambulating   Vital signs in last 24 hours: [min-max] current  Temp:  [99 F (37.2 C)-99.5 F (37.5 C)] 99.2 F (37.3 C) (10/27 0457) Pulse Rate:  [104-110] 108 (10/27 0457) Resp:  [16-17] 16 (10/27 0457) BP: (120-129)/(76-91) 129/89 (10/27 0457) SpO2:  [100 %] 100 % (10/27 0457) Weight:  [52.4 kg] 52.4 kg (10/27 0500)     Height: 5' 1 (154.9 cm) Weight: 52.4 kg BMI (Calculated): 21.83   Intake/Output last 2 shifts:  10/26 0701 - 10/27 0700 In: 1536.6 [I.V.:1506.6; NG/GT:30] Out: 125 [Emesis/NG output:125]   Physical Exam:  Constitutional: alert, cooperative and no distress  HEENT: NGT in place; low output and dilute (removed) Respiratory: breathing non-labored at rest  Cardiovascular: regular rate and sinus rhythm  Gastrointestinal: soft, incisional soreness, and non-distended, no rebound/guarding Integumentary: Laparotomy is CDI with staples and honeycomb, no significant drainage   Labs:     Latest Ref Rng & Units 04/24/2024    4:55 AM 04/23/2024    5:50 AM 04/22/2024    5:11 AM  CBC  WBC 4.0 - 10.5 K/uL 6.0  6.9  6.1   Hemoglobin 12.0 - 15.0 g/dL 9.4  9.4  89.9   Hematocrit 36.0 - 46.0 % 28.5  28.4  30.0   Platelets 150 - 400 K/uL 161  147  148       Latest Ref Rng & Units 04/24/2024    4:55 AM 04/23/2024    5:50 AM 04/22/2024    5:11 AM  CMP  Glucose 70 - 99 mg/dL 895  895  891   BUN 8 - 23 mg/dL 19  17  14    Creatinine 0.44 - 1.00 mg/dL 9.63  9.41  9.42   Sodium 135 - 145  mmol/L 135  137  140   Potassium 3.5 - 5.1 mmol/L 4.4  3.9  3.5   Chloride 98 - 111 mmol/L 101  103  101   CO2 22 - 32 mmol/L 27  27  27    Calcium 8.9 - 10.3 mg/dL 8.4  8.1  8.2   Total Protein 6.5 - 8.1 g/dL 5.8     Total Bilirubin 0.0 - 1.2 mg/dL 0.5     Alkaline Phos 38 - 126 U/L 40     AST 15 - 41 U/L 18     ALT 0 - 44 U/L 11        Imaging studies: No new pertinent imaging studies   Assessment/Plan:  73 y.o. female 4 Days Post-Op s/p exploratory laparotomy and LOA for small bowel obstruction   - NGT removed  - We can do CLD today  - Continue TPN; monitor electrolytes   - Monitor abdominal examination; on-going bowel function   - Pain control prn; antiemetics prn  - Mobilize  - Honeycomb can be removed at anytime; no drainage so left in place for now for comfort   - Further management per primary service; we will follow   All of the above findings and recommendations were  discussed with the patient, and the medical team, and all of patient's questions were answered to her expressed satisfaction.  -- Arthea Platt, PA-C [facility] 04/24/2024, 9:03 AM M-F: 7am - 4pm

## 2024-04-24 NOTE — Progress Notes (Signed)
 PHARMACY - TOTAL PARENTERAL NUTRITION CONSULT NOTE   Indication: Prolonged ileus  Patient Measurements: Height: 5' 1 (154.9 cm) Weight: 52.4 kg (115 lb 8 oz) IBW/kg (Calculated) : 47.8 TPN AdjBW (KG): 50.8 Body mass index is 21.82 kg/m. Usual Weight: -  Assessment:  Patient is a 73 year old female with a past medical history of HTN, HLD, and GERD. She has been diagnosed with a high-grade SBO with closed-loop obstruction. Pharmacy has been consulted to initiate patient on TPN given potential delay in initiation of diet and NPO status.   Glucose / Insulin: No history of diabetes per chart review, BG 104-124 Electrolytes:  K 4.4, Mg 2.0, Phos 3.3 Renal: Scr 0.8 (baseline) >> 0.36 Hepatic: AST 18, ALT 11 Intake / Output; MIVF: Net I/O +8.8L since admit Weight up 1.6 kg since 10/23 GI Imaging: 10/20 CT a/p: High-grade small bowel obstruction with a closed loop obstruction.  10/21 XR abd: Persistent dilatation of small bowel loops consistent with small bowel obstruction. Not significantly changed from prior exam. 10/22 XR abd: Findings consistent with small bowel obstruction, with multiple dilated small bowel loops in the mid abdomen measuring up to 5.3 cm in diameter. Unchanged from prior exam. 10/23 XR abd: No significant change in partial small bowel obstruction.  GI Surgeries / Procedures:  10/23 exploratory laparotomy   Central access: ordered to be placed 10/24 TPN start date: 10/24  Nutritional Goals: Goal TPN rate is 70 mL/hr (provides 92.4 g of protein and 1760.64 kcals per day)  RD Assessment: Estimated Needs Total Energy Estimated Needs: 1600-1800 Total Protein Estimated Needs: 85-100 grams Total Fluid Estimated Needs: 1.6-1.8 L  Current Nutrition:  NPO >> switching to CLD starting 10/27  Plan:  Continue TPN at goal rate of 72mL/hr at 1800 Electrolytes in TPN: Na 49mEq/L, K 70mEq/L(decreased from 70mEq/L on 10/27), Ca 50mEq/L, Mg 100mEq/L(decreased from 10mEq/L  on 10/27), and Phos 63mmol/L. Cl:Ac 1:1 Add standard MVI and trace elements to TPN Thiamine 100 mg IV daily x 7 days outside of TPN Continue Sensitive q8h SSI and adjust as needed  Monitor TPN labs on Mon/Thurs  Will M. Lenon, PharmD, BCPS Clinical Pharmacist 04/24/2024 7:30 AM

## 2024-04-24 NOTE — Progress Notes (Signed)
 Progress Note   Patient: KELLI ROBECK FMW:[id number] DOB: [birth year] DOA: 04/17/2024     7 DOS: the patient was seen and examined on 04/24/2024      Brief hospital course:    TRISTIAN BOUSKA is a 73 y.o. African-American female with medical history significant for hypertension, dyslipidemia and GERD, who presented to the emergency room with abdominal pain.  Patient found to have high-grade small bowel obstruction with closed-loop obstruction.  General surgery following   Consultants:  General surgery    Procedures/Surgeries: none   ASSESSMENT & PLAN:   SBO (small bowel obstruction)  Patient is status post exploratory laparotomy with lysis of adhesion done by general surgeon on 04/20/2024 Advancement of diet according to surgical record NG tube discontinued General surgery following  Pain management  Continue TPN Continue to monitor bowel function   Essential hypertension as needed IV hydralazine. Continue amlodipine  and metoprolol  BP has been at goal, hold hydrochlorothiazide    Dyslipidemia Statin.   GERD without esophagitis IV PPI therapy   Allergies restart po meds fexofenadine, montelukast     DVT prophylaxis: lovenox  IV fluids: LR w/ dextrose continuous IV fluids until able to take po      TOC needs: TBD   Subjective Denies nausea vomiting abdominal pain chest pain cough Tolerating TPN well NG tube has been removed   Family Communication: none at this time      Physical Exam Constitutional:      General: She is not in acute distress. Cardiovascular:     Rate and Rhythm: Normal rate and regular rhythm.  Abdominal: Abdominal midline incision appears clean and dry    Palpations: Abdomen is soft.     Tenderness: There is no abdominal tenderness.  Neurological:     Mental Status: She is alert and oriented to person, place, and time.  Psychiatric:        Mood and Affect: Mood normal.        Behavior: Behavior normal.       Data Reviewed:       Latest Ref Rng & Units 04/24/2024    4:55 AM 04/23/2024    5:50 AM 04/22/2024    5:11 AM  CBC  WBC 4.0 - 10.5 K/uL 6.0  6.9  6.1   Hemoglobin 12.0 - 15.0 g/dL 9.4  9.4  89.9   Hematocrit 36.0 - 46.0 % 28.5  28.4  30.0   Platelets 150 - 400 K/uL 161  147  148        Latest Ref Rng & Units 04/24/2024    4:55 AM 04/23/2024    5:50 AM 04/22/2024    5:11 AM  BMP  Glucose 70 - 99 mg/dL 895  895  891   BUN 8 - 23 mg/dL 19  17  14    Creatinine 0.44 - 1.00 mg/dL 9.63  9.41  9.42   Sodium 135 - 145 mmol/L 135  137  140   Potassium 3.5 - 5.1 mmol/L 4.4  3.9  3.5   Chloride 98 - 111 mmol/L 101  103  101   CO2 22 - 32 mmol/L 27  27  27    Calcium 8.9 - 10.3 mg/dL 8.4  8.1  8.2      Vitals:   04/23/24 2011 04/24/24 0457 04/24/24 0500 04/24/24 0956  BP: 126/76 129/89  (!) 140/79  Pulse: (!) 110 (!) 108    Resp: 16 16  20   Temp: 99.5 F (37.5 C) 99.2 F (37.3 C)  98.8 F (37.1 C)  TempSrc: Oral Oral    SpO2: 100% 100%  100%  Weight:   52.4 kg   Height:        Author: Drue ONEIDA Potter, MD 04/24/2024 4:31 PM  For on call review [email or web address].

## 2024-04-25 DIAGNOSIS — K56609 Unspecified intestinal obstruction, unspecified as to partial versus complete obstruction: Secondary | ICD-10-CM | POA: Diagnosis not present

## 2024-04-25 LAB — CBC WITH DIFFERENTIAL/PLATELET
Abs Immature Granulocytes: 0.16 K/uL — ABNORMAL HIGH (ref 0.00–0.07)
Basophils Absolute: 0 K/uL (ref 0.0–0.1)
Basophils Relative: 0 %
Eosinophils Absolute: 0.1 K/uL (ref 0.0–0.5)
Eosinophils Relative: 1 %
HCT: 30.4 % — ABNORMAL LOW (ref 36.0–46.0)
Hemoglobin: 10.2 g/dL — ABNORMAL LOW (ref 12.0–15.0)
Immature Granulocytes: 3 %
Lymphocytes Relative: 4 %
Lymphs Abs: 0.2 K/uL — ABNORMAL LOW (ref 0.7–4.0)
MCH: 23 pg — ABNORMAL LOW (ref 26.0–34.0)
MCHC: 33.6 g/dL (ref 30.0–36.0)
MCV: 68.5 fL — ABNORMAL LOW (ref 80.0–100.0)
Monocytes Absolute: 0.3 K/uL (ref 0.1–1.0)
Monocytes Relative: 5 %
Neutro Abs: 5.7 K/uL (ref 1.7–7.7)
Neutrophils Relative %: 87 %
Platelets: 188 K/uL (ref 150–400)
RBC: 4.44 MIL/uL (ref 3.87–5.11)
RDW: 15.2 % (ref 11.5–15.5)
Smear Review: NORMAL
WBC: 6.5 K/uL (ref 4.0–10.5)
nRBC: 0.3 % — ABNORMAL HIGH (ref 0.0–0.2)

## 2024-04-25 LAB — GLUCOSE, CAPILLARY: Glucose-Capillary: 107 mg/dL — ABNORMAL HIGH (ref 70–99)

## 2024-04-25 LAB — BASIC METABOLIC PANEL WITH GFR
Anion gap: 9 (ref 5–15)
BUN: 21 mg/dL (ref 8–23)
CO2: 26 mmol/L (ref 22–32)
Calcium: 8.6 mg/dL — ABNORMAL LOW (ref 8.9–10.3)
Chloride: 100 mmol/L (ref 98–111)
Creatinine, Ser: 0.52 mg/dL (ref 0.44–1.00)
GFR, Estimated: >60 mL/min (ref >60)
Glucose, Bld: 115 mg/dL — ABNORMAL HIGH (ref 70–99)
Potassium: 4.7 mmol/L (ref 3.5–5.1)
Sodium: 135 mmol/L (ref 135–145)

## 2024-04-25 MED ORDER — ADULT MULTIVITAMIN W/MINERALS CH
1.0000 | ORAL_TABLET | Freq: Every day | ORAL | Status: DC
  Administered 2024-04-26: 1 via ORAL
  Filled 2024-04-25: qty 1

## 2024-04-25 MED ORDER — ENSURE PLUS HIGH PROTEIN PO LIQD
237.0000 mL | Freq: Three times a day (TID) | ORAL | Status: DC
  Administered 2024-04-25: 237 mL via ORAL

## 2024-04-25 MED ORDER — TRAVASOL 10 % IV SOLN
INTRAVENOUS | Status: DC
  Filled 2024-04-25: qty 528

## 2024-04-25 NOTE — Progress Notes (Signed)
 Progress Note   Patient: Kim Potter FMW:[id number] DOB: 1950/12/22 DOA: 04/17/2024     8 DOS: the patient was seen and examined on 04/25/2024     Brief hospital course:    Kim Potter is a 73 y.o. African-American female with medical history significant for hypertension, dyslipidemia and GERD, who presented to the emergency room with abdominal pain.  Patient found to have high-grade small bowel obstruction with closed-loop obstruction.  General surgery following   Consultants:  General surgery    Procedures/Surgeries: Laparotomy   ASSESSMENT & PLAN:   SBO (small bowel obstruction)  Patient is status post exploratory laparotomy with lysis of adhesion done by general surgeon on 04/20/2024 Advancement of diet according to surgical record NG tube discontinued on 04/24/2024 General surgery following  Pain management  Continue TPN Continue to monitor bowel function   Essential hypertension as needed IV hydralazine. Continue amlodipine  and metoprolol  BP has been at goal, hold hydrochlorothiazide    Dyslipidemia Continue statin.   GERD without esophagitis Continue IV PPI therapy   Allergies r continue fexofenadine, montelukast     DVT prophylaxis: lovenox  IV fluids: LR w/ dextrose continuous IV fluids until able to take po      TOC needs: TBD   Subjective Denies nausea vomiting abdominal pain chest pain cough Tolerating TPN well Patient tolerating diet well   Family Communication: none at this time      Physical Exam Constitutional:      General: She is not in acute distress. Cardiovascular:     Rate and Rhythm: Normal rate and regular rhythm.  Abdominal: Abdominal midline incision appears clean and dry    Palpations: Abdomen is soft.     Tenderness: There is no abdominal tenderness.  Neurological:     Mental Status: She is alert and oriented to person, place, and time.  Psychiatric:        Mood and Affect: Mood normal.        Behavior: Behavior  normal.       Data Reviewed:      Latest Ref Rng & Units 04/25/2024    5:39 AM 04/24/2024    4:55 AM 04/23/2024    5:50 AM  CBC  WBC 4.0 - 10.5 K/uL 6.5  6.0  6.9   Hemoglobin 12.0 - 15.0 g/dL 89.7  9.4  9.4   Hematocrit 36.0 - 46.0 % 30.4  28.5  28.4   Platelets 150 - 400 K/uL 188  161  147        Latest Ref Rng & Units 04/25/2024    5:39 AM 04/24/2024    4:55 AM 04/23/2024    5:50 AM  BMP  Glucose 70 - 99 mg/dL 884  895  895   BUN 8 - 23 mg/dL 21  19  17    Creatinine 0.44 - 1.00 mg/dL 9.47  9.63  9.41   Sodium 135 - 145 mmol/L 135  135  137   Potassium 3.5 - 5.1 mmol/L 4.7  4.4  3.9   Chloride 98 - 111 mmol/L 100  101  103   CO2 22 - 32 mmol/L 26  27  27    Calcium 8.9 - 10.3 mg/dL 8.6  8.4  8.1       Vitals:   04/24/24 2050 04/25/24 0406 04/25/24 0418 04/25/24 0813  BP: 100/80 119/79  119/79  Pulse: 100 96  (!) 101  Resp: 18 18  16   Temp: 99.5 F (37.5 C) 98.7 F (37.1 C)  99.6 F (37.6 C)  TempSrc:    Oral  SpO2: 100% 100%  100%  Weight:   55.1 kg   Height:         Author: Drue ONEIDA Potter, MD 04/25/2024 2:56 PM  For on call review [email or web address].

## 2024-04-25 NOTE — Progress Notes (Signed)
 [facility] SURGICAL PROGRESS NOTE  Hospital Day(s): 8.   Post op day(s): 5 Days Post-Op.   Interval History:  Patient seen and examined No acute events or new complaints overnight.  Patient reports she is doing well Abdomen is sore; manageable  No fever, chills, nausea, emesis She is without leukocytosis; WBC 6.5K Hgb to 10.2; improved/stable Renal function normal; sCr - 0.52; UO - unmeasured No electrolyte derangements Having flatus and BM Ambulating   Vital signs in last 24 hours: [min-max] current  Temp:  [98.7 F (37.1 C)-99.5 F (37.5 C)] 98.7 F (37.1 C) (10/28 0406) Pulse Rate:  [96-100] 96 (10/28 0406) Resp:  [18-20] 18 (10/28 0406) BP: (100-140)/(74-80) 119/79 (10/28 0406) SpO2:  [97 %-100 %] 100 % (10/28 0406) Weight:  [55.1 kg] 55.1 kg (10/28 0418)     Height: 5' 1 (154.9 cm) Weight: 55.1 kg BMI (Calculated): 22.96   Intake/Output last 2 shifts:  No intake/output data recorded.   Physical Exam:  Constitutional: alert, cooperative and no distress  Respiratory: breathing non-labored at rest  Cardiovascular: regular rate and sinus rhythm  Gastrointestinal: soft, incisional soreness, and non-distended, no rebound/guarding Integumentary: Laparotomy is CDI with staples and honeycomb, no significant drainage   Labs:     Latest Ref Rng & Units 04/25/2024    5:39 AM 04/24/2024    4:55 AM 04/23/2024    5:50 AM  CBC  WBC 4.0 - 10.5 K/uL 6.5  6.0  6.9   Hemoglobin 12.0 - 15.0 g/dL 89.7  9.4  9.4   Hematocrit 36.0 - 46.0 % 30.4  28.5  28.4   Platelets 150 - 400 K/uL 188  161  147       Latest Ref Rng & Units 04/25/2024    5:39 AM 04/24/2024    4:55 AM 04/23/2024    5:50 AM  CMP  Glucose 70 - 99 mg/dL 884  895  895   BUN 8 - 23 mg/dL 21  19  17    Creatinine 0.44 - 1.00 mg/dL 9.47  9.63  9.41   Sodium 135 - 145 mmol/L 135  135  137   Potassium 3.5 - 5.1 mmol/L 4.7  4.4  3.9   Chloride 98 - 111 mmol/L 100  101  103   CO2 22 - 32 mmol/L 26   27  27    Calcium 8.9 - 10.3 mg/dL 8.6  8.4  8.1   Total Protein 6.5 - 8.1 g/dL  5.8    Total Bilirubin 0.0 - 1.2 mg/dL  0.5    Alkaline Phos 38 - 126 U/L  40    AST 15 - 41 U/L  18    ALT 0 - 44 U/L  11       Imaging studies: No new pertinent imaging studies   Assessment/Plan:  73 y.o. female 5 Days Post-Op s/p exploratory laparotomy and LOA for small bowel obstruction   - FLD this AM; If she does well can do soft diet for dinner vs breakfast in AM; ADAT order written  - Wean TPN to 1/2 rate today; Anticipate discontinuing this tomorrow (10/29).   - Monitor abdominal examination; on-going bowel function   - Pain control prn; antiemetics prn  - Mobilize; doing well   - Honeycomb can be removed at anytime; no drainage so left in place for now for comfort   - Further management per primary service; we will follow  - Discharge Planning: She is doing well with ROBF. Begin diet advancement  and wean from TPN. Anticipate ready for DC in next 24-48 hours from surgical standpoint.   All of the above findings and recommendations were discussed with the patient, and the medical team, and all of patient's questions were answered to her expressed satisfaction.  -- Arthea Platt, PA-C [facility] 04/25/2024, 8:01 AM M-F: 7am - 4pm

## 2024-04-25 NOTE — Progress Notes (Signed)
 Nutrition Follow-up  DOCUMENTATION CODES:   Non-severe (moderate) malnutrition in context of chronic illness  INTERVENTION:   Continue TPN per pharmacy- plan is for half rate tonight   Ensure Plus High Protein po TID, each supplement provides 350 kcal and 20 grams of protein  MVI po daily   Daily weights   NUTRITION DIAGNOSIS:   Moderate Malnutrition related to chronic illness as evidenced by mild fat depletion, moderate fat depletion, mild muscle depletion, moderate muscle depletion. -ongoing  GOAL:   Patient will meet greater than or equal to 90% of their needs -met  MONITOR:   PO intake, Supplement acceptance, Labs, Weight trends, Skin, I & O's, Diet advancement  ASSESSMENT:   73 y/o female with h/o HTN, HLD, GERD, laryngeal SCC s/p chemoradiation, thyroid  nodule, IDA, gastritis, GERD, IDA and SBO s/p exploratory laparotomy with lysis of adhesions (2021) and SBO s/p ex lap with LOA 10/23 complicated by post op ileus.  Pt tolerating TPN at goal rate; plan is to reduce to half rate tonight. Refeed labs stable. Pt advanced to a full liquid diet today. Pt documented to have eaten 85% of her breakfast this morning. RD will add supplements to help pt meet her estimated needs. Pt is having bowel function. Per chart, pt is up ~9lbs since admission.    Medications reviewed and include: lovenox , pepcid , insulin, thiamine, TPN  Labs reviewed: K 4.7 wnl P 3.3 wnl, Mg 2.0 wnl- 10/27 Triglycerides 141- 10/27 Hgb 10.2(L), Hct 30.4(L) Cbgs- 107, 120, 106 x 48 hrs  Diet Order:   Diet Order             Diet full liquid Fluid consistency: Thin  Diet effective now                  EDUCATION NEEDS:   Education needs have been addressed  Skin:  Skin Assessment: Skin Integrity Issues: Skin Integrity Issues:: Incisions Incisions: closed abdomen  Last BM:  10/28- type 5  Height:   Ht Readings from Last 1 Encounters:  04/20/24 5' 1 (1.549 m)    Weight:   Wt  Readings from Last 1 Encounters:  04/25/24 55.1 kg    Ideal Body Weight:  47.7 kg  BMI:  Body mass index is 22.95 kg/m.  Estimated Nutritional Needs:   Kcal:  1600-1800kcal/day  Protein:  80-90g/day  Fluid:  1.4-1.6L/day  Augustin Shams MS, RD, LDN If unable to be reached, please send secure chat to RD inpatient available from 8:00a-4:00p daily

## 2024-04-25 NOTE — Progress Notes (Addendum)
 PHARMACY - TOTAL PARENTERAL NUTRITION CONSULT NOTE   Indication: Prolonged ileus  Patient Measurements: Height: 5' 1 (154.9 cm) Weight: 55.1 kg (121 lb 7.6 oz) IBW/kg (Calculated) : 47.8 TPN AdjBW (KG): 50.8 Body mass index is 22.95 kg/m. Usual Weight: -  Assessment:  Patient is a 73 year old female with a past medical history of HTN, HLD, and GERD. She has been diagnosed with a high-grade SBO with closed-loop obstruction. Pharmacy has been consulted to initiate patient on TPN given potential delay in initiation of diet and NPO status.   Glucose / Insulin: No history of diabetes per chart review, BG 106-124 (1 unit SSI in last 24 hours) Electrolytes: K 4.7, Mg 2.0, Phos 3.3 Renal: Scr 0.8 (baseline) >> 0.36>0.52 Hepatic: AST 18, ALT 11 Intake / Output; MIVF: Net I/O +9.2L since admit Weight up 1.6 kg since 10/23 GI Imaging: 10/20 CT a/p: High-grade small bowel obstruction with a closed loop obstruction.  10/21 XR abd: Persistent dilatation of small bowel loops consistent with small bowel obstruction. Not significantly changed from prior exam. 10/22 XR abd: Findings consistent with small bowel obstruction, with multiple dilated small bowel loops in the mid abdomen measuring up to 5.3 cm in diameter. Unchanged from prior exam. 10/23 XR abd: No significant change in partial small bowel obstruction.  GI Surgeries / Procedures:  10/23 exploratory laparotomy   Central access: ordered to be placed 10/24 TPN start date: 10/24  Nutritional Goals: Goal TPN rate is 70 mL/hr (provides 92.4 g of protein and 1760.64 kcals per day)  RD Assessment: Estimated Needs Total Energy Estimated Needs: 1600-1800kcal/day Total Protein Estimated Needs: 80-90g/day Total Fluid Estimated Needs: 1.4-1.6L/day  Current Nutrition:  NPO >> switching to CLD starting 10/27  Plan:  Patient being transitioned to full liquid diet today (10/28) Per surgery, will begin weaning TPN Wean TPN to rate of 40  mL/hr at 1800 Electrolytes in TPN: Na 50mEq/L, K 61mEq/L(decreased from 70mEq/L on 10/27), Ca 59mEq/L, Mg 52mEq/L(decreased from 10mEq/L on 10/27), and Phos 15mmol/L. Cl:Ac 1:1 Add standard MVI and trace elements to TPN Thiamine 100 mg IV daily x 7 days outside of TPN Continue Sensitive q8h SSI and adjust as needed  Monitor TPN labs on Mon/Thurs  Thank you for involving pharmacy in this patient's care.   Damien Napoleon, PharmD Clinical Pharmacist 04/25/2024 7:25 AM

## 2024-04-26 DIAGNOSIS — D696 Thrombocytopenia, unspecified: Secondary | ICD-10-CM

## 2024-04-26 DIAGNOSIS — K56609 Unspecified intestinal obstruction, unspecified as to partial versus complete obstruction: Secondary | ICD-10-CM | POA: Diagnosis not present

## 2024-04-26 DIAGNOSIS — K219 Gastro-esophageal reflux disease without esophagitis: Secondary | ICD-10-CM | POA: Diagnosis not present

## 2024-04-26 DIAGNOSIS — I10 Essential (primary) hypertension: Secondary | ICD-10-CM | POA: Diagnosis not present

## 2024-04-26 LAB — CBC WITH DIFFERENTIAL/PLATELET
Abs Immature Granulocytes: 0.14 K/uL — ABNORMAL HIGH (ref 0.00–0.07)
Basophils Absolute: 0 K/uL (ref 0.0–0.1)
Basophils Relative: 0 %
Eosinophils Absolute: 0.1 K/uL (ref 0.0–0.5)
Eosinophils Relative: 2 %
HCT: 28.1 % — ABNORMAL LOW (ref 36.0–46.0)
Hemoglobin: 9.5 g/dL — ABNORMAL LOW (ref 12.0–15.0)
Immature Granulocytes: 2 %
Lymphocytes Relative: 5 %
Lymphs Abs: 0.3 K/uL — ABNORMAL LOW (ref 0.7–4.0)
MCH: 23.2 pg — ABNORMAL LOW (ref 26.0–34.0)
MCHC: 33.8 g/dL (ref 30.0–36.0)
MCV: 68.5 fL — ABNORMAL LOW (ref 80.0–100.0)
Monocytes Absolute: 0.4 K/uL (ref 0.1–1.0)
Monocytes Relative: 6 %
Neutro Abs: 5.4 K/uL (ref 1.7–7.7)
Neutrophils Relative %: 85 %
Platelets: 213 K/uL (ref 150–400)
RBC: 4.1 MIL/uL (ref 3.87–5.11)
RDW: 14.8 % (ref 11.5–15.5)
Smear Review: NORMAL
WBC: 6.3 K/uL (ref 4.0–10.5)
nRBC: 0 % (ref 0.0–0.2)

## 2024-04-26 LAB — BASIC METABOLIC PANEL WITH GFR
Anion gap: 7 (ref 5–15)
BUN: 22 mg/dL (ref 8–23)
CO2: 27 mmol/L (ref 22–32)
Calcium: 8.7 mg/dL — ABNORMAL LOW (ref 8.9–10.3)
Chloride: 101 mmol/L (ref 98–111)
Creatinine, Ser: 0.61 mg/dL (ref 0.44–1.00)
GFR, Estimated: >60 mL/min (ref >60)
Glucose, Bld: 110 mg/dL — ABNORMAL HIGH (ref 70–99)
Potassium: 4.4 mmol/L (ref 3.5–5.1)
Sodium: 135 mmol/L (ref 135–145)

## 2024-04-26 LAB — GLUCOSE, CAPILLARY
Glucose-Capillary: 108 mg/dL — ABNORMAL HIGH (ref 70–99)
Glucose-Capillary: 116 mg/dL — ABNORMAL HIGH (ref 70–99)

## 2024-04-26 NOTE — Progress Notes (Signed)
 Mobility Specialist Progress Note:    04/26/24 1044  Mobility  Activity Ambulated independently  Level of Assistance Independent  Assistive Device None  Distance Ambulated (ft) 640 ft  Range of Motion/Exercises Active;All extremities  Activity Response Tolerated well  Mobility visit 1 Mobility  Mobility Specialist Start Time (ACUTE ONLY) 1015  Mobility Specialist Stop Time (ACUTE ONLY) 1026  Mobility Specialist Time Calculation (min) (ACUTE ONLY) 11 min   Pt independently able to stand and ambulate with no AD. Tolerated well, asx throughout. Returned to room, all needs met.  Sherrilee Ditty Mobility Specialist Please contact via Special Educational Needs Teacher or  Rehab office at [phone]

## 2024-04-26 NOTE — Progress Notes (Cosign Needed)
 [facility] SURGICAL PROGRESS NOTE  Hospital Day(s): 9.   Post op day(s): 6 Days Post-Op.   Interval History:  Patient seen and examined No acute events or new complaints overnight.  Patient reports she is doing well She reports 0 abdominal pain No fever, chills, nausea, emesis Labs remain reassuring  Having flatus and BM Ambulating   Vital signs in last 24 hours: [min-max] current  Temp:  [98 F (36.7 C)-99.6 F (37.6 C)] 98 F (36.7 C) (10/29 0308) Pulse Rate:  [97-101] 99 (10/29 0308) Resp:  [15-17] 15 (10/29 0308) BP: (104-128)/(71-79) 104/74 (10/29 0308) SpO2:  [99 %-100 %] 100 % (10/29 0308) Weight:  [54.3 kg] 54.3 kg (10/29 0130)     Height: 5' 1 (154.9 cm) Weight: 54.3 kg BMI (Calculated): 22.63   Intake/Output last 2 shifts:  10/28 0701 - 10/29 0700 In: 2146.8 [P.O.:680; I.V.:1466.8] Out: -    Physical Exam:  Constitutional: alert, cooperative and no distress  Respiratory: breathing non-labored at rest  Cardiovascular: regular rate and sinus rhythm  Gastrointestinal: soft, incisional soreness, and non-distended, no rebound/guarding Integumentary: Laparotomy is CDI with staples  Labs:     Latest Ref Rng & Units 04/26/2024    5:21 AM 04/25/2024    5:39 AM 04/24/2024    4:55 AM  CBC  WBC 4.0 - 10.5 K/uL 6.3  6.5  6.0   Hemoglobin 12.0 - 15.0 g/dL 9.5  89.7  9.4   Hematocrit 36.0 - 46.0 % 28.1  30.4  28.5   Platelets 150 - 400 K/uL 213  188  161       Latest Ref Rng & Units 04/26/2024    5:21 AM 04/25/2024    5:39 AM 04/24/2024    4:55 AM  CMP  Glucose 70 - 99 mg/dL 889  884  895   BUN 8 - 23 mg/dL 22  21  19    Creatinine 0.44 - 1.00 mg/dL 9.38  9.47  9.63   Sodium 135 - 145 mmol/L 135  135  135   Potassium 3.5 - 5.1 mmol/L 4.4  4.7  4.4   Chloride 98 - 111 mmol/L 101  100  101   CO2 22 - 32 mmol/L 27  26  27    Calcium 8.9 - 10.3 mg/dL 8.7  8.6  8.4   Total Protein 6.5 - 8.1 g/dL   5.8   Total Bilirubin 0.0 - 1.2 mg/dL   0.5    Alkaline Phos 38 - 126 U/L   40   AST 15 - 41 U/L   18   ALT 0 - 44 U/L   11      Imaging studies: No new pertinent imaging studies   Assessment/Plan:  73 y.o. female 6 Days Post-Op s/p exploratory laparotomy and LOA for small bowel obstruction   - Okay to continue diet as tolerated  - Discontinue TPN today  - Monitor abdominal examination; on-going bowel function   - Pain control prn; antiemetics prn  - Mobilize; doing well   - Further management per primary service; we will follow  - Discharge Planning: Okay for DC from surgical perspective, We will follow up in ~7-10 days for staple removal; instructions updated   All of the above findings and recommendations were discussed with the patient, and the medical team, and all of patient's questions were answered to her expressed satisfaction.  -- Arthea Platt, PA-C [facility] 04/26/2024, 7:37 AM M-F: 7am - 4pm

## 2024-04-26 NOTE — Plan of Care (Signed)
  Problem: Education: Goal: Knowledge of General Education information will improve Description: Including pain rating scale, medication(s)/side effects and non-pharmacologic comfort measures Outcome: Progressing   Problem: Health Behavior/Discharge Planning: Goal: Ability to manage health-related needs will improve Outcome: Progressing   Problem: Clinical Measurements: Goal: Ability to maintain clinical measurements within normal limits will improve Outcome: Progressing Goal: Will remain free from infection Outcome: Progressing Goal: Diagnostic test results will improve Outcome: Progressing Goal: Respiratory complications will improve Outcome: Progressing Goal: Cardiovascular complication will be avoided Outcome: Progressing   Problem: Nutrition: Goal: Adequate nutrition will be maintained Outcome: Progressing   Problem: Activity: Goal: Risk for activity intolerance will decrease Outcome: Progressing   Problem: Coping: Goal: Level of anxiety will decrease Outcome: Progressing   Problem: Elimination: Goal: Will not experience complications related to bowel motility Outcome: Progressing Goal: Will not experience complications related to urinary retention Outcome: Progressing   Problem: Pain Managment: Goal: General experience of comfort will improve and/or be controlled Outcome: Progressing   Problem: Safety: Goal: Ability to remain free from injury will improve Outcome: Progressing   Problem: Skin Integrity: Goal: Risk for impaired skin integrity will decrease Outcome: Progressing   Problem: Education: Goal: Ability to describe self-care measures that may prevent or decrease complications (Diabetes Survival Skills Education) will improve Outcome: Progressing Goal: Individualized Educational Video(s) Outcome: Progressing   Problem: Fluid Volume: Goal: Ability to maintain a balanced intake and output will improve Outcome: Progressing   Problem:  Coping: Goal: Ability to adjust to condition or change in health will improve Outcome: Progressing   Problem: Health Behavior/Discharge Planning: Goal: Ability to identify and utilize available resources and services will improve Outcome: Progressing Goal: Ability to manage health-related needs will improve Outcome: Progressing   Problem: Skin Integrity: Goal: Risk for impaired skin integrity will decrease Outcome: Progressing   Problem: Nutritional: Goal: Maintenance of adequate nutrition will improve Outcome: Progressing Goal: Progress toward achieving an optimal weight will improve Outcome: Progressing   Problem: Tissue Perfusion: Goal: Adequacy of tissue perfusion will improve Outcome: Progressing

## 2024-04-26 NOTE — Plan of Care (Signed)
 PICC line removed, discharge instructions reviewed, and patient to be discharged to home

## 2024-04-26 NOTE — Discharge Summary (Signed)
 Physician Discharge Summary   Patient: Kim Potter MRN: [id number] DOB: 10-10-1950  Admit date:     04/17/2024  Discharge date: 04/26/24  Discharge Physician: Murvin Mana   PCP: Alla Amis, MD   Recommendations at discharge:   Follow-up with PCP in 1 week. Follow-up with general surgery as scheduled  Discharge Diagnoses: Principal Problem:   SBO (small bowel obstruction) (HCC) Active Problems:   Essential hypertension   Dyslipidemia   Malnutrition of moderate degree   GERD without esophagitis  Resolved Problems:   Thrombocytopenia Hypokalemia. Hypophosphatemia. Hypomagnesemia. Hospital Course:  Kim Potter is a 73 y.o. African-American female with medical history significant for hypertension, dyslipidemia and GERD, who presented to the emergency room with abdominal pain  Patient found to have high-grade small bowel obstruction with closed-loop obstruction.  Patient is status post exploratory laparotomy with lysis of adhesion done by general surgeon on 04/20/2024  Patient had a postop ileus, required TPN.  Condition has improved, tolerating diet, medically stable for discharge.  General surgery has cleared patient for discharge   Assessment and Plan:  SBO (small bowel obstruction) status post surgery. Postop ileus. Patient is status post exploratory laparotomy with lysis of adhesion done by general surgeon on 04/20/2024 Patient had required TPN after today. Bowel function has recovered, tolerating diet.  Medically stable for discharge.   Essential hypertension Resume all home treatment Dyslipidemia Continue statin.   GERD without esophagitis Resume home treatment       Consultants: General suregry Procedures performed:  exploratory laparotomy with lysis of adhesion  Disposition: Home Diet recommendation:  Discharge Diet Orders (From admission, onward)     Start     Ordered   04/26/24 0000  Diet - low sodium heart healthy        04/26/24 1005            Cardiac diet DISCHARGE MEDICATION: Allergies as of 04/26/2024       Reactions   Penicillins Itching, Rash   Has patient had a PCN reaction causing immediate rash, facial/tongue/throat swelling, SOB or lightheadedness with hypotension: No Has patient had a PCN reaction causing severe rash involving mucus membranes or skin necrosis: No Has patient had a PCN reaction that required hospitalization: No Has patient had a PCN reaction occurring within the last 10 years: No If all of the above answers are NO, then may proceed with Cephalosporin use.        Medication List     STOP taking these medications    nitrofurantoin (macrocrystal-monohydrate) 100 MG capsule Commonly known as: MACROBID       TAKE these medications    acetaminophen  325 MG tablet Commonly known as: TYLENOL  Take by mouth as needed.   amLODipine  5 MG tablet Commonly known as: NORVASC  Take 5 mg by mouth daily.   atropine 1 % ophthalmic solution Place 1 drop into the right eye at bedtime.   Calcium Carb-Cholecalciferol 600-10 MG-MCG Tabs Take 1 tablet by mouth daily.   famotidine  40 MG tablet Commonly known as: PEPCID  Take 40 mg by mouth daily.   fexofenadine 180 MG tablet Commonly known as: ALLEGRA Take by mouth.   fluticasone  50 MCG/ACT nasal spray Commonly known as: FLONASE  Place 1 spray into both nostrils daily.   hydrochlorothiazide  25 MG tablet Commonly known as: HYDRODIURIL  Take 25 mg by mouth daily.   metoprolol  succinate 50 MG 24 hr tablet Commonly known as: TOPROL -XL Take 50 mg by mouth daily.   montelukast 10 MG tablet Commonly known  as: SINGULAIR Take 10 mg by mouth at bedtime.   polyethylene glycol powder 17 GM/SCOOP powder Commonly known as: GLYCOLAX/MIRALAX Take 17 g by mouth as needed for mild constipation.   prednisoLONE acetate 1 % ophthalmic suspension Commonly known as: PRED FORTE Place 1 drop into the right eye daily.   simvastatin  20 MG  tablet Commonly known as: ZOCOR  Take 20 mg by mouth at bedtime.   sucralfate  1 g tablet Commonly known as: CARAFATE  Take 1 g by mouth 4 (four) times daily.   Wal-Mucil 28.3 % Powd Generic drug: Psyllium Take 3.4 g by mouth daily.               Discharge Care Instructions  (From admission, onward)           Start     Ordered   04/26/24 0000  Discharge wound care:       Comments: Follow with general surgery   04/26/24 1005            Follow-up Information     Schulz, Zachary R, PA-C. Go on 05/03/2024.   Specialty: Physician Assistant Why: Go to appointment on 11/05 at 115 PM Contact information: [address] [place] [address] [phone]         Alla Amis, MD Follow up in 1 week(s).   Specialty: Family Medicine Contact information: [address] [facility] [place] [address] [phone]                Discharge Exam: Kim Potter   04/24/24 0500 04/25/24 0418 04/26/24 0130  Weight: 52.4 kg 55.1 kg 54.3 kg   General exam: Appears calm and comfortable, moderately malnourished Respiratory system: Clear to auscultation. Respiratory effort normal. Cardiovascular system: S1 & S2 heard, RRR. No JVD, murmurs, rubs, gallops or clicks. No pedal edema. Gastrointestinal system: Abdomen is nondistended, soft and nontender. No organomegaly or masses felt. Normal bowel sounds heard. Central nervous system: Alert and oriented. No focal neurological deficits. Extremities: Symmetric 5 x 5 power. Skin: No rashes, lesions or ulcers Psychiatry: Judgement and insight appear normal. Mood & affect appropriate.    Condition at discharge: good  The results of significant diagnostics from this hospitalization (including imaging, microbiology, ancillary and laboratory) are listed below for reference.   Imaging Studies: DG ABD ACUTE 2+V W 1V CHEST Result Date: 04/22/2024 EXAM: UPRIGHT AND SUPINE XRAY VIEWS OF THE  ABDOMEN AND 4 VIEW(S) OF THE CHEST 04/22/2024 04:45:00 AM COMPARISON: 04/20/2024 CLINICAL HISTORY: FINDINGS: LUNGS AND PLEURA: Retrocardiac airspace opacity likely due to atelectasis. No consolidation or pulmonary edema. No pleural effusion or pneumothorax. HEART AND MEDIASTINUM: Right upper extremity PICC with tip terminating over the superior cavoatrial junction. Aortic atherosclerosis. No acute abnormality of the cardiac and mediastinal silhouettes. BOWEL: Enteric tube with tip and side port overlying the expected region of the gastric lumen. Improved small bowel dilatation within the mid abdomen. No bowel obstruction. PERITONEUM AND SOFT TISSUES: Midline surgical staples. Left upper quadrant surgical clips. No abnormal calcifications. No free air. BONES: No acute osseous abnormality. IMPRESSION: 1. Improved small bowel dilatation within the mid abdomen. Electronically signed by: Waddell Calk MD 04/22/2024 05:30 AM EDT RP Workstation: [id number]   US  EKG SITE RITE Result Date: 04/21/2024 If Site Rite image not attached, placement could not be confirmed due to current cardiac rhythm.  DG ABD ACUTE 2+V W 1V CHEST Result Date: 04/20/2024 CLINICAL DATA:  Follow-up small bowel obstruction. EXAM: DG ABDOMEN ACUTE W/ 1V CHEST COMPARISON:  04/19/2024 and chest radiographs dated 04/24/2022. FINDINGS: Interval mildly enlarged cardiac silhouette. The lungs remain hyperexpanded mild peribronchial thickening. No significant change in gas and oral contrast in dilated small bowel loops. Some of the contrast has progressed into normal caliber right and transverse colon. 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 IMPRESSION: 1. No significant change in partial small bowel obstruction. 2. Interval mild cardiomegaly. 3. Stable mild changes of COPD and chronic bronchitis. Electronically Signed   By: Elspeth Bathe M.D.   On: 04/20/2024 10:57   DG Abd Portable 1V-Small Bowel Obstruction Protocol-initial, 8 hr  delay Result Date: 04/19/2024 EXAM: 1 VIEW XRAY OF THE ABDOMEN 04/19/2024 06:01:00 PM COMPARISON: Same day x-rays CLINICAL HISTORY: FINDINGS: BOWEL: Oral contrast retained within gastric fundus and multiple dilated loops of small bowel in abdomen. Maximal small bowel diameter 5.5 cm. Findings compatible with small bowel obstruction. SOFT TISSUES: No opaque urinary calculi. BONES: No acute osseous abnormality. IMPRESSION: 1. Findings compatible with small bowel obstruction. Electronically signed by: Norman Gatlin MD 04/19/2024 06:54 PM EDT RP Workstation: [id number]   DG ABD ACUTE 2+V W 1V CHEST Result Date: 04/19/2024 EXAM: UPRIGHT AND SUPINE XRAY VIEWS OF THE ABDOMEN AND 4 VIEW(S) OF THE CHEST 04/19/2024 06:52:00 AM COMPARISON: None available. CLINICAL HISTORY: Small bowel obstruction (HCC) [id number]. Small bowel obstruction. FINDINGS: LUNGS AND PLEURA: No consolidation or pulmonary edema. No pleural effusion or pneumothorax. HEART AND MEDIASTINUM: No acute abnormality of the cardiac and mediastinal silhouettes. BOWEL: Unchanged appearance of multiple dilated small bowel loops in the mid abdomen measuring up to 5.3 cm diameter. PERITONEUM AND SOFT TISSUES: No abnormal calcifications. No free air. BONES: No acute osseous abnormality. IMPRESSION: 1. Findings consistent with small bowel obstruction, with multiple dilated small bowel loops in the mid abdomen measuring up to 5.3 cm in diameter. Unchanged from prior exam. 2. No free intraperitoneal air detected. Electronically signed by: Waddell Calk MD 04/19/2024 08:37 AM EDT RP Workstation: [id number]   DG Abd 1 View Result Date: 04/18/2024 EXAM: 1 VIEW XRAY OF THE ABDOMEN 04/18/2024 07:09:53 AM COMPARISON: 04/17/2024 CLINICAL HISTORY: SBO (small bowel obstruction) (HCC) [id number]. sbo. FINDINGS: BOWEL: Persistent dilatation of small bowel loops consistent with small bowel obstruction. Small bowel dilatation stable compared to prior. SOFT TISSUES: No opaque  urinary calculi. BONES: No acute osseous abnormality. IMPRESSION: 1. Persistent dilatation of small bowel loops consistent with small bowel obstruction. Not significantly changed from prior exam. Electronically signed by: Waddell Calk MD 04/18/2024 04:20 PM EDT RP Workstation: [id number]   DG Abd 2 Views Addendum Date: 04/17/2024 ADDENDUM REPORT: 04/17/2024 15:26 ADDENDUM: Critical Value/emergent results were called by telephone at the time of interpretation on 04/17/2024 at 3:23 pm to [facility], the patient's nurse, with instructions to pull or reposition the tube with no contrast or other administration through the tube until good position verified. She verbally acknowledged these results. Electronically Signed   By: Elspeth Bathe M.D.   On: 04/17/2024 15:26   Result Date: 04/17/2024 CLINICAL DATA:  Follow-up small bowel obstruction, 8 hours following contrast administration through the patient's nasogastric tube. The technologist reports that patient's nasogastric tube was still taped to the patient's nose at the time of current image acquisition. EXAM: DG ABDOMEN 2V COMPARISON:  Earlier today. FINDINGS: The nasogastric tube is no longer seen in the included chest or abdomen. Interval small amount of contrast at the medial left lung base in the left lower lobe. The remainder of the included lungs are clear. Borderline enlarged cardiac silhouette. The included upper abdomen  demonstrates mildly progressive proximal small bowel dilatation measuring up to 4.6 cm in diameter without correction for magnification. Lower lumbar spine degenerative changes and mild scoliosis. IMPRESSION: 1. The nasogastric tube is no longer seen in the included chest or abdomen. 2. Interval small amount of aspirated contrast in the left lower lobe. 3. Mildly progressive proximal small bowel obstruction. Note: Critical value is being called to the patient's provider. Once this is accomplished, a reported addendum will be issued.  Electronically Signed: By: Elspeth Bathe M.D. On: 04/17/2024 15:12   DG Abd Portable 1V Result Date: 04/17/2024 CLINICAL DATA:  [id number].  For NGT placement. EXAM: PORTABLE ABDOMEN - 1 VIEW COMPARISON:  CT with IV contrast from earlier today . FINDINGS: 4:09 a.m. Current image excludes the pelvis. Small bowel dilatation up to 3.6 cm continues to be seen. There is no supine evidence of free air. NGT has been inserted. The tube does enter the stomach but the side hole is at the EG junction and needs to be advanced further in 5-8 cm. There are artifacts from overlying clothing and telemetry wires. There is contrast within normal caliber bilateral renal collecting systems. IMPRESSION: 1. NGT does enter the stomach but the side hole is at the EG junction and needs to be advanced further in 5-8 cm. 2. Small bowel dilatation continues to be seen. Little if any change. Electronically Signed   By: Francis Quam M.D.   On: 04/17/2024 05:20   CT ABDOMEN PELVIS W CONTRAST Result Date: 04/17/2024 EXAM: CT ABDOMEN AND PELVIS WITH CONTRAST 04/17/2024 02:15:26 AM TECHNIQUE: CT of the abdomen and pelvis was performed with the administration of 100 mL of iohexol  (OMNIPAQUE ) 300 MG/ML solution. Multiplanar reformatted images are provided for review. Automated exposure control, iterative reconstruction, and/or weight-based adjustment of the mA/kV was utilized to reduce the radiation dose to as low as reasonably achievable. COMPARISON: ct abd Pelvis 12/27/2019. CLINICAL HISTORY: Prior history of SBO s/p colectomy, now with similar pain, nausea, no bowel movements. Pt presented to ED with c/o abd pain, nausea, vomiting x 2 hours. States hx of GERD and SBO. States vomit has been undigested food. Pt also endorses diarrhea. A\T\Ox4. FINDINGS: LOWER CHEST: No acute abnormality. LIVER: The liver is unremarkable. GALLBLADDER AND BILE DUCTS: Gallbladder is unremarkable. No biliary ductal dilatation. SPLEEN: No acute abnormality. PANCREAS:  No acute abnormality. ADRENAL GLANDS: No acute abnormality. KIDNEYS, URETERS AND BLADDER: No stones in the kidneys or ureters. No hydronephrosis. No perinephric or periureteral stranding. Urinary bladder is unremarkable. GI AND BOWEL: Stomach demonstrates no acute abnormality. Several loops of mid small bowel are dilated with fluid with caliber measuring up to 3.6 cm in associated air-fluid levels. There is a transition point within the lower abdomen as well as a second transition point shortly after that with a short loop of bowel between these transition points also noted to be dilated with fluid (5.45). Large bowel is normal in caliber. No pneumatosis. PERITONEUM AND RETROPERITONEUM: No ascites. No free air. VASCULATURE: Aorta is normal in caliber. LYMPH NODES: No lymphadenopathy. REPRODUCTIVE ORGANS: No acute abnormality. BONES AND SOFT TISSUES: No acute osseous abnormality. No focal soft tissue abnormality. IMPRESSION: 1. High-grade small  bowel obstruction with a closed loop obstruction. 2. Nonobstructive bilateral nephrolithiasis. PRA to call report. Electronically signed by: Morgane Naveau MD 04/17/2024 02:45 AM EDT RP Workstation: [id number]    Microbiology: Results for orders placed or performed during the hospital encounter of 06/25/22  Resp panel by RT-PCR (RSV, Flu A&B, Covid) Anterior Nasal  Swab     Status: None   Collection Time: 06/24/22  8:48 PM   Specimen: Anterior Nasal Swab  Result Value Ref Range Status   SARS Coronavirus 2 by RT PCR NEGATIVE NEGATIVE Final    Comment: (NOTE) SARS-CoV-2 target nucleic acids are NOT DETECTED.  The SARS-CoV-2 RNA is generally detectable in upper respiratory specimens during the acute phase of infection. The lowest concentration of SARS-CoV-2 viral copies this assay can detect is 138 copies/mL. A negative result does not preclude SARS-Cov-2 infection and should not be used as the sole basis for treatment or other patient management decisions. A  negative result may occur with  improper specimen collection/handling, submission of specimen other than nasopharyngeal swab, presence of viral mutation(s) within the areas targeted by this assay, and inadequate number of viral copies(<138 copies/mL). A negative result must be combined with clinical observations, patient history, and epidemiological information. The expected result is Negative.  Fact Sheet for Patients:  bloggercourse.com  Fact Sheet for Healthcare Providers:  seriousbroker.it  This test is no t yet approved or cleared by the [place]  FDA and  has been authorized for detection and/or diagnosis of SARS-CoV-2 by FDA under an Emergency Use Authorization (EUA). This EUA will remain  in effect (meaning this test can be used) for the duration of the COVID-19 declaration under Section 564(b)(1) of the Act, 21 U.S.C.section 360bbb-3(b)(1), unless the authorization is terminated  or revoked sooner.       Influenza A by PCR NEGATIVE NEGATIVE Final   Influenza B by PCR NEGATIVE NEGATIVE Final    Comment: (NOTE) The Xpert Xpress SARS-CoV-2/FLU/RSV plus assay is intended as an aid in the diagnosis of influenza from Nasopharyngeal swab specimens and should not be used as a sole basis for treatment. Nasal washings and aspirates are unacceptable for Xpert Xpress SARS-CoV-2/FLU/RSV testing.  Fact Sheet for Patients: bloggercourse.com  Fact Sheet for Healthcare Providers: seriousbroker.it  This test is not yet approved or cleared by the [place]  FDA and has been authorized for detection and/or diagnosis of SARS-CoV-2 by FDA under an Emergency Use Authorization (EUA). This EUA will remain in effect (meaning this test can be used) for the duration of the COVID-19 declaration under Section 564(b)(1) of the Act, 21 U.S.C. section 360bbb-3(b)(1), unless the authorization  is terminated or revoked.     Resp Syncytial Virus by PCR NEGATIVE NEGATIVE Final    Comment: (NOTE) Fact Sheet for Patients: bloggercourse.com  Fact Sheet for Healthcare Providers: seriousbroker.it  This test is not yet approved or cleared by the [place]  FDA and has been authorized for detection and/or diagnosis of SARS-CoV-2 by FDA under an Emergency Use Authorization (EUA). This EUA will remain in effect (meaning this test can be used) for the duration of the COVID-19 declaration under Section 564(b)(1) of the Act, 21 U.S.C. section 360bbb-3(b)(1), unless the authorization is terminated or revoked.  Performed at [facility], [address], [place], [place] [address]   Urine Culture     Status: Abnormal   Collection Time: 06/24/22  8:48 PM   Specimen: Urine, Clean Catch  Result Value Ref Range Status   Specimen Description   Final    URINE, CLEAN CATCH Performed at [facility], [address], [place], [place] [address]    Special Requests   Final    NONE Performed at [facility], [address] [place], [place] [address]    Culture MULTIPLE SPECIES PRESENT, SUGGEST RECOLLECTION (A)  Final  Report Status 06/26/2022 FINAL  Final    Labs: CBC: Recent Labs  Lab 04/22/24 0511 04/23/24 0550 04/24/24 0455 04/25/24 0539 04/26/24 0521  WBC 6.1 6.9 6.0 6.5 6.3  NEUTROABS 5.5 6.3 5.3 5.7 5.4  HGB 10.0* 9.4* 9.4* 10.2* 9.5*  HCT 30.0* 28.4* 28.5* 30.4* 28.1*  MCV 69.4* 68.8* 68.8* 68.5* 68.5*  PLT 148* 147* 161 188 213   Basic Metabolic Panel: Recent Labs  Lab 04/21/24 0502 04/22/24 0511 04/23/24 0550 04/24/24 0455 04/25/24 0539 04/26/24 0521  NA 142 140 137 135 135 135  K 3.3* 3.5 3.9 4.4 4.7 4.4  CL 105 101 103 101 100 101  CO2 27 27 27 27 26 27   GLUCOSE 136* 108* 104* 104* 115* 110*  BUN 10 14 17 19 21 22   CREATININE 0.80 0.57 9.41 0.36* 0.52 0.61  CALCIUM 8.5*  8.2* 8.1* 8.4* 8.6* 8.7*  MG 1.6* 2.0 1.9 2.0  --   --   PHOS 3.3 2.0* 3.0 3.3  --   --    Liver Function Tests: Recent Labs  Lab 04/24/24 0455  AST 18  ALT 11  ALKPHOS 40  BILITOT 0.5  PROT 5.8*  ALBUMIN 2.8*   CBG: Recent Labs  Lab 04/24/24 1613 04/24/24 2324 04/25/24 0810 04/26/24 0022 04/26/24 0734  GLUCAP 106* 120* 107* 108* 116*    Discharge time spent: 35 minutes.  Signed: Murvin Mana, MD [facility] 04/26/2024

## 2024-04-26 NOTE — Discharge Instructions (Signed)
 In addition to included general post-operative instructions,  Diet: Resume home diet.   Activity: No heavy lifting >20 pounds (children, pets, laundry, garbage) or strenuous activity for 6 weeks, but light activity and walking are encouraged. Do not drive or drink alcohol if taking narcotic pain medications or having pain that might distract from driving.  Wound care: You may shower/get incision wet with soapy water and pat dry (do not rub incisions), but no baths or submerging incision underwater until follow-up. We will remove staples on 11/05  Medications: Resume all home medications. For mild to moderate pain: acetaminophen  (Tylenol ) or ibuprofen /naproxen (if no kidney disease). Combining Tylenol  with alcohol can substantially increase your risk of causing liver disease. Narcotic pain medications, if prescribed, can be used for severe pain, though may cause nausea, constipation, and drowsiness. Do not combine Tylenol  and Percocet (or similar) within a 6 hour period as Percocet (and similar) contain(s) Tylenol . If you do not need the narcotic pain medication, you do not need to fill the prescription.  Call office [phone] / [phone]) at any time if any questions, worsening pain, fevers/chills, bleeding, drainage from incision site, or other concerns.

## 2024-05-02 DIAGNOSIS — Z8719 Personal history of other diseases of the digestive system: Secondary | ICD-10-CM | POA: Diagnosis not present

## 2024-05-02 DIAGNOSIS — R7309 Other abnormal glucose: Secondary | ICD-10-CM | POA: Diagnosis not present

## 2024-05-02 NOTE — Progress Notes (Signed)
 Chief Complaint  Patient presents with  . Hospital Follow Up    HPI  History of Present Illness  73 year old female here for hospital follow-up  Hospital follow-up: Patient was admitted to [facility] on 04/17/2024 and discharged on 04/26/2024 with acute small bowel obstruction status post ex lap with lysis of adhesions on 04/20/2024.  Afterwards had postop ileus.  She has a ventral incision that is healing and has plans to remove staples tomorrow with her surgeon.  Reports that her bowels are moving and she has flatulence.  Denies any nausea, vomiting, fever, pain, bleeding from the surgical wound or drainage.  ROS Review of systems is unremarkable for any active cardiac, respiratory, GI, GU, hematologic, neurologic, dermatologic, HEENT, or psychiatric symptoms except as noted above.  No fevers, chills, or constitutional symptoms.   Current Outpatient Medications  Medication Sig Dispense Refill  . acetaminophen  (TYLENOL ) 325 MG tablet Take 2 tablets by mouth every 6 (six) hours as needed    . amLODIPine  (NORVASC ) 5 MG tablet Take 1 tablet by mouth once daily 90 tablet 0  . atropine 1 % ophthalmic solution INSTILL 1 DROP INTO RIGHT EYE EVERY NIGHT    . calcium carbonate-vitamin D3 (CALTRATE 600+D) 600 mg-10 mcg (400 unit) tablet Take 1 tablet by mouth 2 (two) times daily with meals    . famotidine  (PEPCID ) 20 MG tablet Take 40 mg by mouth once daily as needed for Heartburn    . fluticasone  propionate (FLONASE ) 50 mcg/actuation nasal spray Place 1 spray into both nostrils once daily (Patient taking differently: Place 1 spray into both nostrils once daily as needed) 16 g 3  . metoprolol  SUCCinate (TOPROL -XL) 50 MG XL tablet Take 1 tablet by mouth once daily 100 tablet 0  . montelukast (SINGULAIR) 10 mg tablet Take 1 tablet (10 mg total) by mouth at bedtime 100 tablet 1  . polyethylene glycol (MIRALAX) powder Take 17 g by mouth once daily as needed for Constipation Mix in 4-8ounces of fluid prior to  taking.    . prednisoLONE acetate (PRED FORTE) 1 % ophthalmic suspension Place 1 drop into the right eye 2 (two) times daily    . psyllium husk, with sugar, (METAMUCIL) 3.4 gram/12 gram oral powder Take 3.4 g by mouth once daily as needed for Constipation Mix with a full glass (240 mL) of fluid.    . simvastatin  (ZOCOR ) 20 MG tablet Take 1 tablet (20 mg total) by mouth at bedtime 90 tablet 1   No current facility-administered medications for this visit.    Allergies as of 05/02/2024 - Reviewed 05/02/2024  Allergen Reaction Noted  . Macrobid [nitrofurantoin monohyd/m-cryst] Vomiting 05/02/2024  . Penicillins Itching and Rash 01/08/2016    Patient Active Problem List  Diagnosis  . History of laryngeal cancer - followed by [facility]  . History of tachycardia on metoprolol   . Pure hypercholesterolemia (LDL 102 - 05/11/23)  . Gastroesophageal reflux disease without esophagitis  . Medicare annual wellness visit, initial 10/24/20  . Essential hypertension  . Allergic rhinitis, mild  . Medicare annual wellness visit, subsequent 11/08/23  . Reactive airway disease without complication (HHS-HCC)  . Age-related osteoporosis without current pathological fracture (Dexa 12/28/22) - unable to swallow to fosamax    Past Medical History:  Diagnosis Date  . GERD (gastroesophageal reflux disease)   . History of cancer   . Hyperlipidemia   . Hypertension   . Osteoporosis   . Thyroid  disease    nodule only    Past Surgical  History:  Procedure Laterality Date  . EXPLORATORY LAPAROTOMY N/A 12/29/2019   lysis of adhesions for SBO. by I. Tye  . bowel obstruction    . HYSTERECTOMY    . laryngeal cancer      Vitals:   05/02/24 1133  BP: 118/76  Pulse: 74  SpO2: 97%  Weight: 46.5 kg (102 lb 9.6 oz)  Height: 154.9 cm (5' 1)  PainSc: 0-No pain   Body mass index is 19.39 kg/m.  Exam  General. Well appearing; NAD; VS reviewed     Eyes. Sclera and conjunctiva clear; Vision grossly  intact; extraocular movements intact Neck. Supple.    Lungs. Respirations unlabored; clear to auscultation bilaterally Cardiovascular. Heart regular rate and rhythm without murmurs, gallops, or rubs Abd. soft, nontender, nondistended.   Skin. Normal color and turgor; surgical wound intact and appears to be healing appropriately with staples in place.  No fluctuance or induration surrounding the wound.  No erythema Neurologic. Alert and oriented x3; CN 2-12 grossly intact; no focal deficits  Assessment and Plan 1. History of small bowel obstruction Resolved.  Appears to be healing appropriately.  Surgery evaluation tomorrow with plans to remove staples.  2. Elevated glucose Acute during hospitalization.  Check A1c today. -     Hemoglobin A1C; Future  Other orders -     simvastatin  (ZOCOR ) 20 MG tablet; Take 1 tablet (20 mg total) by mouth at bedtime  Medications and allergies reviewed and reconciled.  Hospital notes, labs, and studies reviewed.  Transitional phone call complete within 48 hours of discharge.  Follow-up as scheduled.  A1c and FLP today since she is fasting Assessment & Plan    ALDA CARPEN, MD

## 2024-05-03 ENCOUNTER — Ambulatory Visit: Admitting: Physician Assistant

## 2024-05-03 ENCOUNTER — Encounter: Payer: Self-pay | Admitting: Physician Assistant

## 2024-05-03 VITALS — BP 110/70 | HR 79 | Ht 61.0 in | Wt 100.2 lb

## 2024-05-03 DIAGNOSIS — K56609 Unspecified intestinal obstruction, unspecified as to partial versus complete obstruction: Secondary | ICD-10-CM

## 2024-05-03 DIAGNOSIS — Z09 Encounter for follow-up examination after completed treatment for conditions other than malignant neoplasm: Secondary | ICD-10-CM

## 2024-05-03 NOTE — Progress Notes (Signed)
 [facility] POST-OP OFFICE VISIT  05/03/2024  HPI: Kim Potter is a 73 y.o. female s/p exploratory laparotomy and LOA for small bowel obstruction on 10/23  She is doing remarkably well considering She reports no abdominal pain No fever, chills, nausea, emesis Appetite is improved; bowel function returned to normal Incision is healing well Ambulating   Vital signs: BP 110/70   Pulse 79   Ht 5' 1 (1.549 m)   Wt 100 lb 3.2 oz (45.5 kg)   LMP 01/08/1979 (Approximate)   SpO2 95%   BMI 18.93 kg/m    Physical Exam: Constitutional: Well appearing female, NAD Abdomen: Soft, non-tender, non-distended, no rebound/guarding Skin: Laparotomy is well healed with staples (removed), no erythema or drainage   Assessment/Plan: This is a 73 y.o. female s/p exploratory laparotomy and LOA for small bowel obstruction on 10/23   - Staples removed; no issues   - Pain control prn  - Reviewed wound care recommendation  - Reviewed lifting restrictions; 6 weeks total  - I will follow up with her in~1 month; She understands to call with questions/concerns in the interim   -- Arthea Platt, PA-C [facility] 05/03/2024, 10:52 AM M-F: 7am - 4pm

## 2024-05-03 NOTE — Patient Instructions (Addendum)
 Follow-up with our office in 1 month.  Please call and ask to speak with a nurse if you develop questions or concerns.   GENERAL POST-OPERATIVE PATIENT INSTRUCTIONS   WOUND CARE INSTRUCTIONS:  Keep a dry clean dressing on the wound if there is drainage. The initial bandage may be removed after 24 hours.  Once the wound has quit draining you may leave it open to air.  If clothing rubs against the wound or causes irritation and the wound is not draining you may cover it with a dry dressing during the daytime.  Try to keep the wound dry and avoid ointments on the wound unless directed to do so.  If the wound becomes bright red and painful or starts to drain infected material that is not clear, please contact your physician immediately.  If the wound is mildly pink and has a thick firm ridge underneath it, this is normal, and is referred to as a healing ridge.  This will resolve over the next 4-6 weeks.  BATHING: You may shower if you have been informed of this by your surgeon. However, Please do not submerge in a tub, hot tub, or pool until incisions are completely sealed or have been told by your surgeon that you may do so.  DIET:  You may eat any foods that you can tolerate.  It is a good idea to eat a high fiber diet and take in plenty of fluids to prevent constipation.  If you do become constipated you may want to take a mild laxative or take ducolax tablets on a daily basis until your bowel habits are regular.  Constipation can be very uncomfortable, along with straining, after recent surgery.  ACTIVITY:  You are encouraged to cough and deep breath or use your incentive spirometer if you were given one, every 15-30 minutes when awake.  This will help prevent respiratory complications and low grade fevers post-operatively if you had a general anesthetic.  You may want to hug a pillow when coughing and sneezing to add additional support to the surgical area, if you had abdominal or chest surgery,  which will decrease pain during these times.  You are encouraged to walk and engage in light activity for the next two weeks.  You should not lift more than 20 pounds for 6 weeks total after surgery as it could put you at increased risk for complications.  Twenty pounds is roughly equivalent to a plastic bag of groceries. At that time- Listen to your body when lifting, if you have pain when lifting, stop and then try again in a few days. Soreness after doing exercises or activities of daily living is normal as you get back in to your normal routine.  MEDICATIONS:  Try to take narcotic medications and anti-inflammatory medications, such as tylenol , ibuprofen , naprosyn, etc., with food.  This will minimize stomach upset from the medication.  Should you develop nausea and vomiting from the pain medication, or develop a rash, please discontinue the medication and contact your physician.  You should not drive, make important decisions, or operate machinery when taking narcotic pain medication.  SUNBLOCK Use sun block to incision area over the next year if this area will be exposed to sun. This helps decrease scarring and will allow you avoid a permanent darkened area over your incision.  QUESTIONS:  Please feel free to call our office if you have any questions, and we will be glad to assist you. [phone]

## 2024-05-08 DIAGNOSIS — H2011 Chronic iridocyclitis, right eye: Secondary | ICD-10-CM | POA: Diagnosis not present

## 2024-05-08 DIAGNOSIS — Z961 Presence of intraocular lens: Secondary | ICD-10-CM | POA: Diagnosis not present

## 2024-05-08 DIAGNOSIS — H2512 Age-related nuclear cataract, left eye: Secondary | ICD-10-CM | POA: Diagnosis not present

## 2024-05-10 DIAGNOSIS — R7303 Prediabetes: Secondary | ICD-10-CM | POA: Diagnosis not present

## 2024-05-10 DIAGNOSIS — R7989 Other specified abnormal findings of blood chemistry: Secondary | ICD-10-CM | POA: Diagnosis not present

## 2024-05-10 DIAGNOSIS — E7800 Pure hypercholesterolemia, unspecified: Secondary | ICD-10-CM | POA: Diagnosis not present

## 2024-05-10 DIAGNOSIS — I10 Essential (primary) hypertension: Secondary | ICD-10-CM | POA: Diagnosis not present

## 2024-05-10 DIAGNOSIS — Z87898 Personal history of other specified conditions: Secondary | ICD-10-CM | POA: Diagnosis not present

## 2024-05-31 ENCOUNTER — Ambulatory Visit: Admitting: Physician Assistant

## 2024-05-31 ENCOUNTER — Encounter: Payer: Self-pay | Admitting: Physician Assistant

## 2024-05-31 VITALS — BP 135/82 | HR 80 | Temp 98.5°F | Ht 61.0 in | Wt 104.4 lb

## 2024-05-31 DIAGNOSIS — Z09 Encounter for follow-up examination after completed treatment for conditions other than malignant neoplasm: Secondary | ICD-10-CM

## 2024-05-31 DIAGNOSIS — K56609 Unspecified intestinal obstruction, unspecified as to partial versus complete obstruction: Secondary | ICD-10-CM

## 2024-05-31 NOTE — Patient Instructions (Signed)

## 2024-05-31 NOTE — Progress Notes (Signed)
 [facility] POST-OP OFFICE VISIT  05/31/2024  HPI: Kim Potter is a 73 y.o. female s/p exploratory laparotomy and LOA for small bowel obstruction on 10/23   She has done well Occasional abdominal pain, mild, come and go randomly No fever, chills, nausea, emesis Tolerating PO; bowel are normal Incision is well healed She is ambulatory and at baseline No other complaints  Vital signs: BP 135/82   Pulse 80   Temp 98.5 F (36.9 C) (Oral)   Ht 5' 1 (1.549 m)   Wt 104 lb 6.4 oz (47.4 kg)   LMP 01/08/1979 (Approximate)   SpO2 100%   BMI 19.73 kg/m    Physical Exam: Constitutional: Well appearing female, NAD Abdomen: Soft, non-tender, non-distended, no rebound/guarding Skin: Laparotomy is well healed, no erythema or drainage   Assessment/Plan: This is a 73 y.o. female s/p exploratory laparotomy and LOA for small bowel obstruction on 10/23    - Pain control prn; OTC medications as needed  - Reviewed wound care recommendations; Nothing further  - Reviewed lifting restrictions; she has completed these  - We can follow up on as needed basis; She understands to call with questions/concerns  -- Arthea Platt, PA-C [facility] 05/31/2024, 1:58 PM M-F: 7am - 4pm

## 2024-06-26 NOTE — Progress Notes (Signed)
 Kim Potter                                          MRN: [id number]   06/26/2024   The [facility] Quality Team Specialist reviewed this patient medical record for the purposes of chart review for care gap closure. The following were reviewed: abstraction for care gap closure-controlling blood pressure.    [facility] Quality Team
